# Patient Record
Sex: Male | Born: 1960 | Race: White | Hispanic: No | Marital: Married | Smoking: Never smoker
Health system: Southern US, Community
[De-identification: ages and names within clinical notes are randomized; demographics above are authoritative.]

## PROBLEM LIST (undated history)

## (undated) DIAGNOSIS — E7800 Pure hypercholesterolemia, unspecified: Secondary | ICD-10-CM

## (undated) DIAGNOSIS — N182 Chronic kidney disease, stage 2 (mild): Secondary | ICD-10-CM

## (undated) DIAGNOSIS — I2510 Atherosclerotic heart disease of native coronary artery without angina pectoris: Secondary | ICD-10-CM

## (undated) DIAGNOSIS — S83207A Unspecified tear of unspecified meniscus, current injury, left knee, initial encounter: Secondary | ICD-10-CM

## (undated) DIAGNOSIS — K921 Melena: Secondary | ICD-10-CM

## (undated) DIAGNOSIS — K219 Gastro-esophageal reflux disease without esophagitis: Secondary | ICD-10-CM

## (undated) DIAGNOSIS — Z86010 Personal history of colonic polyps: Secondary | ICD-10-CM

## (undated) DIAGNOSIS — M1712 Unilateral primary osteoarthritis, left knee: Secondary | ICD-10-CM

## (undated) DIAGNOSIS — J411 Mucopurulent chronic bronchitis: Secondary | ICD-10-CM

## (undated) DIAGNOSIS — E669 Obesity, unspecified: Secondary | ICD-10-CM

## (undated) DIAGNOSIS — I10 Essential (primary) hypertension: Secondary | ICD-10-CM

## (undated) DIAGNOSIS — J301 Allergic rhinitis due to pollen: Secondary | ICD-10-CM

## (undated) DIAGNOSIS — Z860101 Personal history of adenomatous and serrated colon polyps: Secondary | ICD-10-CM

## (undated) DIAGNOSIS — Z87448 Personal history of other diseases of urinary system: Secondary | ICD-10-CM

## (undated) DIAGNOSIS — E66812 Obesity, class 2: Secondary | ICD-10-CM

## (undated) HISTORY — DX: Obesity, unspecified: E66.9

## (undated) HISTORY — DX: Mucopurulent chronic bronchitis: J41.1

## (undated) HISTORY — DX: Unilateral primary osteoarthritis, left knee: M17.12

## (undated) HISTORY — DX: Atherosclerotic heart disease of native coronary artery without angina pectoris: I25.10

## (undated) HISTORY — DX: Personal history of other diseases of urinary system: Z87.448

## (undated) HISTORY — DX: Personal history of colonic polyps: Z86.010

## (undated) HISTORY — DX: Allergic rhinitis due to pollen: J30.1

## (undated) HISTORY — DX: Personal history of adenomatous and serrated colon polyps: Z86.0101

## (undated) HISTORY — DX: Obesity, class 2: E66.812

## (undated) HISTORY — DX: Unspecified tear of unspecified meniscus, current injury, left knee, initial encounter: S83.207A

## (undated) HISTORY — PX: COLONOSCOPY: SHX174

## (undated) HISTORY — DX: Chronic kidney disease, stage 2 (mild): N18.2

## (undated) HISTORY — DX: Pure hypercholesterolemia, unspecified: E78.00

## (undated) HISTORY — DX: Melena: K92.1

## (undated) HISTORY — DX: Gastro-esophageal reflux disease without esophagitis: K21.9

---

## 2008-06-24 HISTORY — PX: CORONARY ANGIOPLASTY WITH STENT PLACEMENT: SHX49

## 2008-09-22 HISTORY — PX: CARDIAC CATHETERIZATION: SHX172

## 2008-10-13 DIAGNOSIS — I249 Acute ischemic heart disease, unspecified: Secondary | ICD-10-CM | POA: Insufficient documentation

## 2008-10-13 DIAGNOSIS — R072 Precordial pain: Secondary | ICD-10-CM | POA: Insufficient documentation

## 2008-10-13 DIAGNOSIS — N179 Acute kidney failure, unspecified: Secondary | ICD-10-CM | POA: Insufficient documentation

## 2008-10-15 DIAGNOSIS — E782 Mixed hyperlipidemia: Secondary | ICD-10-CM | POA: Insufficient documentation

## 2013-06-24 HISTORY — PX: CARDIOVASCULAR STRESS TEST: SHX262

## 2016-03-21 DIAGNOSIS — K649 Unspecified hemorrhoids: Secondary | ICD-10-CM

## 2016-03-21 HISTORY — DX: Unspecified hemorrhoids: K64.9

## 2016-04-18 ENCOUNTER — Encounter: Payer: Self-pay | Admitting: Family Medicine

## 2019-06-25 DIAGNOSIS — M25562 Pain in left knee: Secondary | ICD-10-CM

## 2019-06-25 HISTORY — DX: Pain in left knee: M25.562

## 2019-09-16 ENCOUNTER — Other Ambulatory Visit: Payer: Self-pay

## 2019-09-16 ENCOUNTER — Emergency Department (HOSPITAL_BASED_OUTPATIENT_CLINIC_OR_DEPARTMENT_OTHER)
Admission: EM | Admit: 2019-09-16 | Discharge: 2019-09-16 | Disposition: A | Discharge status: Home / Self Care | Payer: BC Managed Care – PPO | Attending: Emergency Medicine | Admitting: Emergency Medicine

## 2019-09-16 ENCOUNTER — Encounter (HOSPITAL_BASED_OUTPATIENT_CLINIC_OR_DEPARTMENT_OTHER): Payer: Self-pay

## 2019-09-16 DIAGNOSIS — E782 Mixed hyperlipidemia: Secondary | ICD-10-CM | POA: Diagnosis not present

## 2019-09-16 DIAGNOSIS — M79604 Pain in right leg: Secondary | ICD-10-CM | POA: Diagnosis not present

## 2019-09-16 DIAGNOSIS — I10 Essential (primary) hypertension: Secondary | ICD-10-CM | POA: Diagnosis not present

## 2019-09-16 DIAGNOSIS — R2243 Localized swelling, mass and lump, lower limb, bilateral: Secondary | ICD-10-CM | POA: Diagnosis not present

## 2019-09-16 DIAGNOSIS — M79605 Pain in left leg: Secondary | ICD-10-CM | POA: Insufficient documentation

## 2019-09-16 HISTORY — DX: Essential (primary) hypertension: I10

## 2019-09-16 NOTE — ED Provider Notes (Addendum)
[facility] EMERGENCY DEPARTMENT Provider Note   CSN: [id number] Arrival date & time: 09/16/19  1735     History Chief Complaint  Patient presents with  . Leg Pain    Blake Gomez is a 59 y.o. male.  The history is provided by the patient.  Leg Pain Location:  Leg Time since incident:  2 weeks Leg location:  L leg and R leg Pain details:    Quality:  Aching   Radiates to:  Does not radiate   Severity:  Mild   Onset quality:  Gradual   Timing:  Intermittent   Progression:  Waxing and waning Prior injury to area:  No Relieved by:  Nothing Worsened by:  Activity Associated symptoms: no back pain and no fever        Past Medical History:  Diagnosis Date  . High cholesterol   . Hypertension     There are no problems to display for this patient.   Past Surgical History:  Procedure Laterality Date  . CORONARY ANGIOPLASTY WITH STENT PLACEMENT         No family history on file.  Social History   Tobacco Use  . Smoking status: Never Smoker  . Smokeless tobacco: Never Used  Substance Use Topics  . Alcohol use: Never  . Drug use: Never    Home Medications Prior to Admission medications   Not on File    Allergies    Patient has no known allergies.  Review of Systems   Review of Systems  Constitutional: Negative for chills and fever.  HENT: Negative for ear pain and sore throat.   Eyes: Negative for pain and visual disturbance.  Respiratory: Negative for cough and shortness of breath.   Cardiovascular: Positive for leg swelling. Negative for chest pain and palpitations.  Gastrointestinal: Negative for abdominal pain and vomiting.  Genitourinary: Negative for dysuria and hematuria.  Musculoskeletal: Negative for arthralgias and back pain.  Skin: Negative for color change and rash.  Neurological: Negative for seizures and syncope.  All other systems reviewed and are negative.   Physical Exam Updated Vital Signs  ED Triage Vitals    Enc Vitals Group     BP 09/16/19 1747 120/80     Pulse Rate 09/16/19 1747 66     Resp 09/16/19 1747 18     Temp 09/16/19 1747 98.2 F (36.8 C)     Temp Source 09/16/19 1747 Oral     SpO2 09/16/19 1747 97 %     Weight 09/16/19 1747 265 lb (120.2 kg)     Height 09/16/19 1747 5\' 9"  (1.753 m)     Head Circumference --      Peak Flow --      Pain Score 09/16/19 1743 9     Pain Loc --      Pain Edu? --      Excl. in GC? --     Physical Exam Vitals and nursing note reviewed.  Constitutional:      General: He is not in acute distress.    Appearance: He is well-developed. He is not ill-appearing.  HENT:     Head: Normocephalic and atraumatic.     Nose: Nose normal.     Mouth/Throat:     Mouth: Mucous membranes are moist.  Eyes:     Extraocular Movements: Extraocular movements intact.     Conjunctiva/sclera: Conjunctivae normal.     Pupils: Pupils are equal, round, and reactive to light.  Cardiovascular:  Rate and Rhythm: Normal rate and regular rhythm.     Pulses: Normal pulses.     Heart sounds: Normal heart sounds. No murmur.  Pulmonary:     Effort: Pulmonary effort is normal. No respiratory distress.     Breath sounds: Normal breath sounds.  Abdominal:     Palpations: Abdomen is soft.     Tenderness: There is no abdominal tenderness.  Musculoskeletal:        General: No tenderness. Normal range of motion.     Cervical back: Normal range of motion and neck supple.     Right lower leg: Edema (trace) present.     Left lower leg: Edema (trace) present.  Skin:    General: Skin is warm and dry.     Capillary Refill: Capillary refill takes less than 2 seconds.  Neurological:     General: No focal deficit present.     Mental Status: He is alert.     Sensory: No sensory deficit.     Motor: No weakness.  Psychiatric:        Mood and Affect: Mood normal.     ED Results / Procedures / Treatments   Labs (all labs ordered are listed, but only abnormal results are  displayed) Labs Reviewed - No data to display  EKG None  Radiology No results found.  Procedures Procedures (including critical care time)  Medications Ordered in ED Medications - No data to display  ED Course  I have reviewed the triage vital signs and the nursing notes.  Pertinent labs & imaging results that were available during my care of the patient were reviewed by me and considered in my medical decision making (see chart for details).    MDM Rules/Calculators/A&P                      Blake Gomez is a 59 year old male who presents to the ED with bilateral leg pain.  Patient works as a Development worker, community.  States that he has pain when he is working on his knees.  States that he gets cramps in his calves at times.  Wears hard sole shoes/boots.  He does not have an exam that is consistent with DVT.  He has strong pulses in his legs and doubt arterial process.  He has trace edema in his legs that I suspect may be causing some of his pain and likely from dependent edema from being on his feet and knees all day.  He has no shortness of breath.  Clear breath sounds.  No concern for heart failure.  Overall suspect muscle related pain due to work.  Recommend compression socks, more comfortable shoes.  Discharged in good condition.  Recommend follow-up with primary care doctor.  This chart was dictated using voice recognition software.  Despite best efforts to proofread,  errors can occur which can change the documentation meaning.    Final Clinical Impression(s) / ED Diagnoses Final diagnoses:  Pain in both lower extremities    Rx / DC Orders ED Discharge Orders    None       Lennice Sites, DO 09/16/19 Coleridge, [place], DO 09/16/19 1805

## 2019-09-16 NOTE — Discharge Instructions (Addendum)
Make sure you stay hydrated while working, suggest that you buy compression socks, possibly buy inserts for the soles of your shoe, try Motrin 600 mg every 8 hours as needed as well as Tylenol 1000 mg every 6 hours as needed

## 2019-09-16 NOTE — ED Notes (Signed)
Pt left before receiving d/c instructions 

## 2019-09-16 NOTE — ED Triage Notes (Signed)
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

## 2019-09-29 ENCOUNTER — Other Ambulatory Visit: Payer: Self-pay

## 2019-09-30 ENCOUNTER — Ambulatory Visit: Payer: BC Managed Care – PPO | Admitting: Family Medicine

## 2019-09-30 ENCOUNTER — Encounter: Payer: Self-pay | Admitting: Family Medicine

## 2019-09-30 ENCOUNTER — Other Ambulatory Visit: Payer: Self-pay

## 2019-09-30 ENCOUNTER — Ambulatory Visit (HOSPITAL_BASED_OUTPATIENT_CLINIC_OR_DEPARTMENT_OTHER)
Admission: RE | Admit: 2019-09-30 | Discharge: 2019-09-30 | Disposition: A | Discharge status: Home / Self Care | Payer: BC Managed Care – PPO | Source: Ambulatory Visit | Attending: Family Medicine | Admitting: Family Medicine

## 2019-09-30 VITALS — BP 113/74 | HR 84 | Temp 98.6°F | Resp 16 | Ht 70.5 in | Wt 263.4 lb

## 2019-09-30 DIAGNOSIS — I2510 Atherosclerotic heart disease of native coronary artery without angina pectoris: Secondary | ICD-10-CM | POA: Diagnosis not present

## 2019-09-30 DIAGNOSIS — M79662 Pain in left lower leg: Secondary | ICD-10-CM

## 2019-09-30 DIAGNOSIS — M19072 Primary osteoarthritis, left ankle and foot: Secondary | ICD-10-CM | POA: Diagnosis not present

## 2019-09-30 DIAGNOSIS — E7800 Pure hypercholesterolemia, unspecified: Secondary | ICD-10-CM | POA: Diagnosis not present

## 2019-09-30 DIAGNOSIS — Z86010 Personal history of colonic polyps: Secondary | ICD-10-CM | POA: Diagnosis not present

## 2019-09-30 DIAGNOSIS — M25462 Effusion, left knee: Secondary | ICD-10-CM | POA: Diagnosis not present

## 2019-09-30 MED ORDER — HYDROCODONE-ACETAMINOPHEN 5-325 MG PO TABS: 1.0000 | ORAL_TABLET | Freq: Four times a day (QID) | ORAL | 0 refills | Status: DC | PRN

## 2019-09-30 MED ORDER — DICLOFENAC SODIUM 1 % EX GEL: 4.0000 g | Freq: Four times a day (QID) | CUTANEOUS | 2 refills | Status: DC

## 2019-09-30 NOTE — Patient Instructions (Signed)
REst as much as possible for 2 wks. Stop all otc meds for pain. Apply ice to affected areas for 20 min twice per day.

## 2019-09-30 NOTE — Progress Notes (Signed)
Office Note 09/30/2019  CC:  Chief Complaint  Patient presents with  . Establish Care    Previous PCP passed away, shortly after going to [facility] for any medical needs.    HPI:  Blake Gomez is a 59 y.o. male who is here accompanied by his wife [name] to establish care and discuss leg pain problems. Patient's most recent primary MD: [name], [facility] in [place], [place]. Old records were reviewed prior to or during today's visit.  Recent ED visit for "bilat LL pain" 09/16/19. Reviewed record today. Per provider note, history and exam c/w muscular pain +/- discomfort from mild LE edema. It was felt that his work as a Development worker, community, always getting down on knees, bending a lot at knees, etc is the source of lots of his LL probs. No labs or imaging were done, no meds rx'd. Advised to f/u with primary care.  Most recent general exam and blood work were done 2019 approx.  HTN: well controlled in the past. HLD: tolerating statin. CAD: compliant with ASA/plavix/BB. No CP, SOB, palp's, arm or jaw pain, diaphoresis, nausea, or dizziness.  Onset about 1 mo ago: worsening of chronic, intermittent bilat LL pain. No preceding acute injury. Occurs mainly when he dips down working low, hurts from knee down to foot, L L much worse than R.  In fact, R leg not really bothering him right now.  No notable swelling or redness of knees.  No locking up or giving way of knees. Entire circumference of legs affected.  Some intermittent feeling of tingling and numbness in feet, occ same in L knee anterior aspect.  No hx of any imaging to eval this problem. No redness of skin noted.  No hx of DVT. He did flooring work for 20 yrs as well. Elevating legs make it worse.  Question of mild swelling on L leg.  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 Takes motrin and bayer: daily for the past week.  He recalls a bad fall approx 2012, was on ladder and it slid and he fell approx  20 ft, lots of bruising.  He did not fracture anything.  Some problems with grip weakness intermittently, related to diffuse finger joints pain.     Past Medical History:  Diagnosis Date  . Bronchitis, mucopurulent recurrent ([place])   . CAD (coronary artery disease)    stent 2014.  No MI  . GERD (gastroesophageal reflux disease)   . Hay fever   . Hematochezia 2017; 2019   Hematochezia/screening: 2017 Colonoscopy normal except adenomatous polyp.  2019 colonoscopy, +polyp, recall 2022.  [name] History of adenomatous polyp of colon 2017; 2019   Recall 2022  . Hypercholesterolemia   . Hypertension   . Obesity, Class II, BMI 35-39.9     Past Surgical History:  Procedure Laterality Date  . COLONOSCOPY  2017/2019   Hematochezia/screening: 2017 Colonoscopy normal except adenomatous polyp.  2019 colonoscopy, +polyp, recall 2022.  [name] CORONARY ANGIOPLASTY WITH STENT PLACEMENT  2014    Family History  Problem Relation Age of Onset  . Heart disease Mother   . Diverticulitis Mother   . Cancer Father   . Early death Brother        car accident  . Alzheimer's disease Maternal Grandmother     Social History   Socioeconomic History  . Marital status: Married    Spouse name: Not on file  . Number of children: Not on file  . Years of education:  Not on file  . Highest education level: Not on file  Occupational History  . Not on file  Tobacco Use  . Smoking status: Never Smoker  . Smokeless tobacco: Never Used  Substance and Sexual Activity  . Alcohol use: Never  . Drug use: Never  . Sexual activity: Not on file  Other Topics Concern  . Not on file  Social History Narrative   Married, wife [name].  No children.   Relocated from [facility] b/c lost job due to covid.   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   Lives on [address]   No T/A/Ds.   Social Determinants of Health   Financial Resource Strain:   . Difficulty of Paying Living Expenses:   Food  Insecurity:   . Worried About Programme researcher, broadcasting/film/video in the Last Year:   . Barista in the Last Year:   Transportation Needs:   . Freight forwarder (Medical):   [name] Lack of Transportation (Non-Medical):   Physical Activity:   . Days of Exercise per Week:   . Minutes of Exercise per Session:   Stress:   . Feeling of Stress :   Social Connections:   . Frequency of Communication with Friends and Family:   . Frequency of Social Gatherings with Friends and Family:   . Attends Religious Services:   . Active Member of Clubs or Organizations:   . Attends Banker Meetings:   [name] Marital Status:   Intimate Partner Violence:   . Fear of Current or Ex-Partner:   . Emotionally Abused:   [name] Physically Abused:   . Sexually Abused:     Outpatient Encounter Medications as of 09/30/2019  Medication Sig  . aspirin 81 MG EC tablet Adult Low Dose Aspirin 81 mg tablet,delayed release  Take 1 tablet every day by oral route.  [name] atorvastatin (LIPITOR) 80 MG tablet atorvastatin 80 mg tablet  . clopidogrel (PLAVIX) 75 MG tablet clopidogrel 75 mg tablet  . Fish Oil-Krill Oil (MEGARED ADVANCED 4 IN 1 PO) Take by mouth daily.  . folic acid (FOLVITE) 800 MCG tablet folic acid 800 mcg tablet  Take 1 tablet every day by oral route.  . metoprolol succinate (TOPROL-XL) 25 MG 24 hr tablet metoprolol succinate ER 25 mg tablet,extended release 24 hr  TAKE ONE TABLET BY MOUTH ONCE DAILY.  . Multiple Vitamins-Minerals (CENTRUM SILVER PO) Centrum Silver Men 300 mcg-600 mcg-300 mcg tablet  Take 1 tablet every day by oral route.  . diclofenac Sodium (VOLTAREN) 1 % GEL Apply 4 g topically 4 (four) times daily.  [name] HYDROcodone-acetaminophen (NORCO/VICODIN) 5-325 MG tablet Take 1-2 tablets by mouth every 6 (six) hours as needed for moderate pain.   No facility-administered encounter medications on file as of 09/30/2019.    No Known Allergies  ROS Review of Systems  Constitutional: Negative for appetite  change, chills, fatigue and fever.  HENT: Negative for congestion, dental problem, ear pain and sore throat.   Eyes: Negative for discharge, redness and visual disturbance.  Respiratory: Negative for cough, chest tightness, shortness of breath and wheezing.   Cardiovascular: Negative for chest pain, palpitations and leg swelling.  Gastrointestinal: Negative for abdominal pain, blood in stool, diarrhea, nausea and vomiting.  Genitourinary: Negative for difficulty urinating, dysuria, flank pain, frequency, hematuria and urgency.  Musculoskeletal: Positive for arthralgias (knees, L>>.  both hands/all fingers stiff). Negative for back pain, joint swelling, myalgias and neck stiffness.  Skin:  Negative for pallor and rash.  Neurological: Negative for dizziness, speech difficulty, weakness and headaches.  Hematological: Negative for adenopathy. Does not bruise/bleed easily.  Psychiatric/Behavioral: Negative for confusion and sleep disturbance. The patient is not nervous/anxious.     PE; Blood pressure 113/74, pulse 84, temperature 98.6 F (37 C), temperature source Temporal, resp. rate 16, height 5' 10.5" (1.791 m), weight 263 lb 6.4 oz (119.5 kg), SpO2 95 %. Body mass index is 37.26 kg/m.  Gen: Alert, well appearing.  Patient is oriented to person, place, time, and situation. AFFECT: pleasant, lucid thought and speech. GYI:RSWN: no injection, icteris, swelling, or exudate.  EOMI, PERRLA. Mouth: lips without lesion/swelling.  Oral mucosa pink and moist. Oropharynx without erythema, exudate, or swelling.  Neck - No masses or thyromegaly or limitation in range of motion CV: RRR, no m/r/g.   LUNGS: CTA bilat, nonlabored resps, good aeration in all lung fields. ABD: soft, NT/ND EXT: no clubbing or cyanosis.  No pitting edema.  DP and PT pulses 2+ bilat. Color of feet/LLs/hands is pink. R knee exam all normal. L knee exam: mild/mod pain when he gets past 90 deg flexion.  Extension ok.  Mild  crepitus. No erythema, warmth, or swelling of knees.  Minimal discomfort with patellar motion.  Very mild TTP diffusely over entire knee but most significantly over patellar tendon and peripatellar soft tissues.  Joint line tenderness minimal. Mild TTP of entire LL w/out any distinct focal area of marked severity.  Pertinent labs:  none  ASSESSMENT AND PLAN:   New pt; reviewed available records.  1) Bilat, L>>>R leg pains from knees down. I suspect he does have some knee osteoarthritis but I don't think that explains all his sx's by any means. I think most of this is overuse syndrome.  No frank signs of any overuse "compartment syndrome". Biggest recommendation at this time is REST-->I recommended 2 wks no work.  He said he'll try. Check L knee and L tib/fib x-rays for signs of signif osteoarthritis and/or stress fracture. Low suspicion of acute DVT, but will check D dimer and if positive I'll get LE venous duplex doppler. Stop all NSAIDs. Start voltaren gel 4 g to affected area qid prn. Vicodin 5/325, 1-2 q6h prn.  Therapeutic expectations and side effect profile of medication discussed today.  Patient's questions answered.  Short term use of this med is the plan and I made this clear today. If x-rays show significant component of knee arthritis (particularly if any effusion is noted), I'll have him return for knee joint steroid injection.  2) CAD: he is asymptomatic and is on ASA, plavix, and metoprolol long term. Will refer him to local cardiologist to resume chronic CAD f/u. Will try to get prior cardiologist's records. Check CMET and CBC today. Lipid panel when we get an opportunity when he is fasting.  3) Hx of adenomatous colon polyps: due for repeat colonoscopy in late 2022.  4) Bilat hands/finger osteoarthritis. No tx for now, but if voltaren helpful for his knee then we may try this for hands.  An After Visit Summary was printed and given to the patient.  Spent 50 min  with pt today gathering hx, reviewing old records, discussing current prob and chronic dz's, and formulating/discussing diagnoses and plans.  Return for f/u to be determined based on results of work up and response to tx.  Signed:  Santiago Bumpers, MD           09/30/2019

## 2019-10-01 LAB — COMPREHENSIVE METABOLIC PANEL
ALT: 19 U/L (ref 0–53)
AST: 23 U/L (ref 0–37)
Albumin: 4.3 g/dL (ref 3.5–5.2)
Alkaline Phosphatase: 110 U/L (ref 39–117)
BUN: 26 mg/dL — ABNORMAL HIGH (ref 6–23)
CO2: 25 mEq/L (ref 19–32)
Calcium: 9 mg/dL (ref 8.4–10.5)
Chloride: 105 mEq/L (ref 96–112)
Creatinine, Ser: 1.1 mg/dL (ref 0.40–1.50)
GFR: 69.02 mL/min (ref >60.00)
Glucose, Bld: 102 mg/dL — ABNORMAL HIGH (ref 70–99)
Potassium: 4.4 mEq/L (ref 3.5–5.1)
Sodium: 140 mEq/L (ref 135–145)
Total Bilirubin: 0.5 mg/dL (ref 0.2–1.2)
Total Protein: 6.9 g/dL (ref 6.0–8.3)

## 2019-10-01 LAB — CBC WITH DIFFERENTIAL/PLATELET
Basophils Absolute: 0.1 10*3/uL (ref 0.0–0.1)
Basophils Relative: 1 % (ref 0.0–3.0)
Eosinophils Absolute: 0.3 10*3/uL (ref 0.0–0.7)
Eosinophils Relative: 3.4 % (ref 0.0–5.0)
HCT: 43.4 % (ref 39.0–52.0)
Hemoglobin: 15.3 g/dL (ref 13.0–17.0)
Lymphocytes Relative: 16.8 % (ref 12.0–46.0)
Lymphs Abs: 1.4 10*3/uL (ref 0.7–4.0)
MCHC: 35.3 g/dL (ref 30.0–36.0)
MCV: 94.7 fl (ref 78.0–100.0)
Monocytes Absolute: 0.5 10*3/uL (ref 0.1–1.0)
Monocytes Relative: 6.2 % (ref 3.0–12.0)
Neutro Abs: 6.2 10*3/uL (ref 1.4–7.7)
Neutrophils Relative %: 72.6 % (ref 43.0–77.0)
Platelets: 260 10*3/uL (ref 150.0–400.0)
RBC: 4.58 Mil/uL (ref 4.22–5.81)
RDW: 13.3 % (ref 11.5–15.5)
WBC: 8.5 10*3/uL (ref 4.0–10.5)

## 2019-10-01 LAB — D-DIMER, QUANTITATIVE: D-Dimer, Quant: 0.45 mcg/mL FEU (ref <0.50)

## 2019-10-05 ENCOUNTER — Encounter: Payer: Self-pay | Admitting: Family Medicine

## 2019-10-06 ENCOUNTER — Telehealth: Payer: Self-pay | Admitting: Family Medicine

## 2019-10-06 DIAGNOSIS — M958 Other specified acquired deformities of musculoskeletal system: Secondary | ICD-10-CM

## 2019-10-06 DIAGNOSIS — M25562 Pain in left knee: Secondary | ICD-10-CM

## 2019-10-06 NOTE — Telephone Encounter (Signed)
MRI knee ordered.

## 2019-10-06 NOTE — Telephone Encounter (Signed)
LM for pt to return call to notify imaging ordered.

## 2019-10-06 NOTE — Telephone Encounter (Signed)
Patient called back today and was given lab results and x-ray results.  Patient is agreeable to MRI.  Please place order.

## 2019-10-06 NOTE — Progress Notes (Signed)
Cardiology Office Note:    Date:  10/07/2019   ID:  Blake Gomez, DOB [birth year], MRN [id number]  PCP:  Jeoffrey Massed, MD  Cardiologist:  Norman Herrlich, MD   Referring MD: Jeoffrey Massed, MD  ASSESSMENT:    1. Coronary artery disease of native artery of native heart with stable angina pectoris (HCC)   2. Dyslipidemia   3. Mixed hyperlipidemia    PLAN:    In order of problems listed above:  1. Stable CAD remote PCI 10 years ago certainly would be at risk with the age of onset and family history and should have an LP(a) level done I cannot reconcile the patient and wife telling me that he had lipids checked with the chart I will send a note to his PCP and they were not done he should come back and have them performed and check an LP(a) level.  If elevated there is a specific antisense oligonucleotide that should be on the market soon to treat this disorder and if he has a residual LDL greater than 70 on a statin I will place him on a PCSK9 inhibitor in the interim.  Continue his current medical treatment and was given a prescription for nitroglycerin.  Gust an ischemia evaluation but the patient I feel he does not need it at this time he is awaiting his records which were requested from [facility] 2. New statin if he did not have a lipid profile should be performed I would do a LP(a) level and if greater than 50 on a statin and a residual LDL greater than 70 in the interim would benefit from combined PCSK9 therapy along with a statin pending approval of the antisense oligonucleotide specifically for LP(a) access.  Next appointment 1 year  Medication Adjustments/Labs and Tests Ordered: Current medicines are reviewed at length with the patient today.  Concerns regarding medicines are outlined above.  Orders Placed This Encounter  Procedures  . EKG 12-Lead   Meds ordered this encounter  Medications  . nitroGLYCERIN (NITROSTAT) 0.4 MG SL tablet    Sig: Place  1 tablet (0.4 mg total) under the tongue every 5 (five) minutes as needed for chest pain.    Dispense:  25 tablet    Refill:  3     No chief complaint on file.   History of Present Illness:    Blake Gomez is a 59 y.o. male who is being seen today for the evaluation of coronary artery disease with a history of myocardial infarction PCI and stent 2011 at the request of McGowen, Maryjean Morn, MD. Unfortunately no cardiology records are available in care everywhere. 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  He has had no chest pain shortness of breath palpitation or syncope compliant with his dual antiplatelet therapy without GI upset or bleeding in his statin without muscle pain or weakness.  He was 59 years old at the time of intervention has a family history of premature CAD was a non-smoker and certainly is at risk for LP(a) abnormality.  He has no history of congenital rheumatic heart disease Past Medical History:  Diagnosis Date  . Bronchitis, mucopurulent recurrent (HCC)   . CAD (coronary artery disease)    stent 2014.  No MI  . GERD (gastroesophageal reflux disease)   .  Hay fever   . Hematochezia 2017; 2019   Hematochezia/screening: 2017 Colonoscopy normal except adenomatous polyp.  2019 colonoscopy, +polyp, recall 2022.  [name] History of adenomatous polyp of colon 2017; 2019   Recall 2022  . Hypercholesterolemia   . Hypertension   . Obesity, Class II, BMI 35-39.9     Past Surgical History:  Procedure Laterality Date  . COLONOSCOPY  2017/2019   Hematochezia/screening: 2017 Colonoscopy normal except adenomatous polyp.  2019 colonoscopy, +polyp, recall 2022.  [name] CORONARY ANGIOPLASTY WITH STENT PLACEMENT  2014    Current Medications: Current Meds  Medication Sig  . aspirin 81 MG EC tablet Adult Low Dose Aspirin 81 mg  tablet,delayed release  Take 1 tablet every day by oral route.  [name] atorvastatin (LIPITOR) 80 MG tablet atorvastatin 80 mg tablet  . clopidogrel (PLAVIX) 75 MG tablet clopidogrel 75 mg tablet  . diclofenac Sodium (VOLTAREN) 1 % GEL Apply 4 g topically 4 (four) times daily.  . Fish Oil-Krill Oil (MEGARED ADVANCED 4 IN 1 PO) Take by mouth daily.  . folic acid (FOLVITE) 800 MCG tablet folic acid 800 mcg tablet  Take 1 tablet every day by oral route.  [name] HYDROcodone-acetaminophen (NORCO/VICODIN) 5-325 MG tablet Take 1-2 tablets by mouth every 6 (six) hours as needed for moderate pain.  . metoprolol succinate (TOPROL-XL) 25 MG 24 hr tablet metoprolol succinate ER 25 mg tablet,extended release 24 hr  TAKE ONE TABLET BY MOUTH ONCE DAILY.  . Multiple Vitamins-Minerals (CENTRUM SILVER PO) Centrum Silver Men 300 mcg-600 mcg-300 mcg tablet  Take 1 tablet every day by oral route.     Allergies:   Patient has no known allergies.   Social History   Socioeconomic History  . Marital status: Married    Spouse name: Not on file  . Number of children: Not on file  . Years of education: Not on file  . Highest education level: Not on file  Occupational History  . Not on file  Tobacco Use  . Smoking status: Never Smoker  . Smokeless tobacco: Never Used  Substance and Sexual Activity  . Alcohol use: Never  . Drug use: Never  . Sexual activity: Not on file  Other Topics Concern  . Not on file  Social History Narrative   Married, wife [name].  No children.   Relocated from [facility] b/c lost job due to covid.   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   Lives on [address]   No T/A/Ds.   Social Determinants of Health   Financial Resource Strain:   . Difficulty of Paying Living Expenses:   Food Insecurity:   . Worried About Programme researcher, broadcasting/film/video in the Last Year:   . Barista in the Last Year:   Transportation Needs:   . Freight forwarder (Medical):   [name] Lack of  Transportation (Non-Medical):   Physical Activity:   . Days of Exercise per Week:   . Minutes of Exercise per Session:   Stress:   . Feeling of Stress :   Social Connections:   . Frequency of Communication with Friends and Family:   . Frequency of Social Gatherings with Friends and Family:   . Attends Religious Services:   . Active Member of Clubs or Organizations:   . Attends Banker Meetings:   [name] Marital Status:      Family History: The patient's family history includes Alzheimer's disease in his maternal grandmother; Cancer in  his father; Diverticulitis in his mother; Early death in his brother; Heart disease in his mother.  ROS:   Review of Systems  Constitution: Negative.  HENT: Negative.   Eyes: Negative.   Cardiovascular: Negative.   Respiratory: Negative.   Endocrine: Negative.   Hematologic/Lymphatic: Negative.   Skin: Negative.   Musculoskeletal: Positive for joint pain.  Gastrointestinal: Negative.   Genitourinary: Negative.   Neurological: Negative.   Psychiatric/Behavioral: Negative.   Allergic/Immunologic: Negative.    Please see the history of present illness.     All other systems reviewed and are negative.  EKGs/Labs/Other Studies Reviewed:    The following studies were reviewed today:   EKG:  EKG is  ordered today.  The ekg ordered today is personally reviewed and demonstrates rhythm and is normal  Recent Labs: 09/30/2019: ALT 19; BUN 26; Creatinine, Ser 1.10; Hemoglobin 15.3; Platelets 260.0; Potassium 4.4; Sodium 140  Recent Lipid Panel Patient and his wife told me he had a lipid profile drawn at his PCP visit  Physical Exam:    VS:  BP 124/72   Pulse 82   Ht 5' 10.5" (1.791 m)   Wt 265 lb 6.4 oz (120.4 kg)   SpO2 98%   BMI 37.54 kg/m     Wt Readings from Last 3 Encounters:  10/07/19 265 lb 6.4 oz (120.4 kg)  09/30/19 263 lb 6.4 oz (119.5 kg)  09/16/19 265 lb (120.2 kg)     GEN:  Well nourished, well developed in no acute  distress HEENT: Normal NECK: No JVD; No carotid bruits LYMPHATICS: No lymphadenopathy CARDIAC: RRR, no murmurs, rubs, gallops RESPIRATORY:  Clear to auscultation without rales, wheezing or rhonchi  ABDOMEN: Soft, non-tender, non-distended MUSCULOSKELETAL:  No edema; No deformity  SKIN: Warm and dry NEUROLOGIC:  Alert and oriented x 3 PSYCHIATRIC:  Normal affect     Signed, Shirlee More, MD  10/07/2019 9:21 AM    [place]

## 2019-10-07 ENCOUNTER — Ambulatory Visit: Payer: BC Managed Care – PPO | Admitting: Cardiology

## 2019-10-07 ENCOUNTER — Other Ambulatory Visit: Payer: Self-pay

## 2019-10-07 ENCOUNTER — Encounter: Payer: Self-pay | Admitting: Cardiology

## 2019-10-07 VITALS — BP 124/72 | HR 82 | Ht 70.5 in | Wt 265.4 lb

## 2019-10-07 DIAGNOSIS — E785 Hyperlipidemia, unspecified: Secondary | ICD-10-CM | POA: Diagnosis not present

## 2019-10-07 DIAGNOSIS — E782 Mixed hyperlipidemia: Secondary | ICD-10-CM

## 2019-10-07 DIAGNOSIS — I25118 Atherosclerotic heart disease of native coronary artery with other forms of angina pectoris: Secondary | ICD-10-CM

## 2019-10-07 MED ORDER — NITROGLYCERIN 0.4 MG SL SUBL: 0.4000 mg | SUBLINGUAL_TABLET | SUBLINGUAL | 3 refills | Status: DC | PRN

## 2019-10-07 NOTE — Patient Instructions (Signed)
Medication Instructions:  Your physician has recommended you make the following change in your medication:  START: Nitroglycerin 0.4 mg take one tablet by mouth every 5 minutes as needed for chest pain.  *If you need a refill on your cardiac medications before your next appointment, please call your pharmacy*   Lab Work: None If you have labs (blood work) drawn today and your tests are completely normal, you will receive your results only by: [name] MyChart Message (if you have MyChart) OR . A paper copy in the mail If you have any lab test that is abnormal or we need to change your treatment, we will call you to review the results.   Testing/Procedures: None   Follow-Up: At [facility], you and your health needs are our priority.  As part of our continuing mission to provide you with exceptional heart care, we have created designated Provider Care Teams.  These Care Teams include your primary Cardiologist (physician) and Advanced Practice Providers (APPs -  Physician Assistants and Nurse Practitioners) who all work together to provide you with the care you need, when you need it.  We recommend signing up for the patient portal called "MyChart".  Sign up information is provided on this After Visit Summary.  MyChart is used to connect with patients for Virtual Visits (Telemedicine).  Patients are able to view lab/test results, encounter notes, upcoming appointments, etc.  Non-urgent messages can be sent to your provider as well.   To learn more about what you can do with MyChart, go to [email or web address].    Your next appointment:   1 year(s)  The format for your next appointment:   In Person  Provider:   Norman Herrlich, MD   Other Instructions

## 2019-10-07 NOTE — Telephone Encounter (Signed)
Patient contacted regarding MRI order.

## 2019-10-08 ENCOUNTER — Encounter: Payer: Self-pay | Admitting: Family Medicine

## 2019-10-08 NOTE — Progress Notes (Signed)
Per PCP's office visit, patient to be out of work for 2 weeks. Patient requested letter for today only.

## 2019-10-12 ENCOUNTER — Telehealth: Payer: Self-pay | Admitting: Family Medicine

## 2019-10-12 DIAGNOSIS — E7800 Pure hypercholesterolemia, unspecified: Secondary | ICD-10-CM

## 2019-10-12 NOTE — Telephone Encounter (Signed)
Pls call and ask pt to schedule fasting lab appt at his earliest convenience. His cardiologist contacted me and has recommended a couple of labs.  I'll place orders and we'll need to forward results to Dr. Steward Drone

## 2019-10-12 NOTE — Telephone Encounter (Signed)
Patient contacted for lab visit, scheduled for 4/23

## 2019-10-14 ENCOUNTER — Encounter: Payer: Self-pay | Admitting: Family Medicine

## 2019-10-15 ENCOUNTER — Ambulatory Visit: Payer: BC Managed Care – PPO

## 2019-10-18 ENCOUNTER — Ambulatory Visit: Payer: BC Managed Care – PPO | Admitting: Medical

## 2019-11-05 ENCOUNTER — Telehealth: Payer: Self-pay

## 2019-11-05 NOTE — Telephone Encounter (Signed)
Insurance forms we received regarding MRI faxed to office. Forms placed back on PCP desk and sending to Dr.McGowen as FYI  Client [facility] Day - Client Client Site [facility] - Day Physician Santiago Bumpers - MD Contact Type Call Who Is Calling Physician / Provider / Hospital Call Type Provider Call Message Only Reason for Call Request to send message to Office Initial Comment Carollee Herter at [address] [facility] in [facility] has a pt. who is asking if their MRI has been authorized by the insurance so it can be scheduled. Pt. Yisroel Ramming (02/01/1961). Please call (whoever answers at ) [phone] or fax to [phone] if have the authorized forms. Office hours provided. Additional Comment Disp. Time Disposition Final User 11/05/2019 7:53:33 AM General Information Provided Yes Gokounous, Erin Call Closed By: Tomie China Transaction Date/Time: 11/05/2019 7:48:51 AM (ET)

## 2019-11-07 NOTE — Telephone Encounter (Signed)
Great - thanks

## 2019-11-13 ENCOUNTER — Ambulatory Visit (HOSPITAL_BASED_OUTPATIENT_CLINIC_OR_DEPARTMENT_OTHER)
Admission: RE | Admit: 2019-11-13 | Discharge: 2019-11-13 | Disposition: A | Discharge status: Home / Self Care | Payer: BC Managed Care – PPO | Source: Ambulatory Visit | Attending: Family Medicine | Admitting: Family Medicine

## 2019-11-13 ENCOUNTER — Other Ambulatory Visit: Payer: Self-pay

## 2019-11-13 DIAGNOSIS — M958 Other specified acquired deformities of musculoskeletal system: Secondary | ICD-10-CM | POA: Insufficient documentation

## 2019-11-13 DIAGNOSIS — M25562 Pain in left knee: Secondary | ICD-10-CM

## 2019-11-13 DIAGNOSIS — M23222 Derangement of posterior horn of medial meniscus due to old tear or injury, left knee: Secondary | ICD-10-CM | POA: Diagnosis not present

## 2019-11-15 ENCOUNTER — Encounter: Payer: Self-pay | Admitting: Family Medicine

## 2019-11-17 ENCOUNTER — Other Ambulatory Visit: Payer: Self-pay

## 2019-11-17 DIAGNOSIS — S83242A Other tear of medial meniscus, current injury, left knee, initial encounter: Secondary | ICD-10-CM

## 2019-11-17 DIAGNOSIS — S83282A Other tear of lateral meniscus, current injury, left knee, initial encounter: Secondary | ICD-10-CM

## 2019-11-17 DIAGNOSIS — M1712 Unilateral primary osteoarthritis, left knee: Secondary | ICD-10-CM

## 2019-11-17 DIAGNOSIS — M25562 Pain in left knee: Secondary | ICD-10-CM

## 2019-12-08 ENCOUNTER — Ambulatory Visit: Payer: BC Managed Care – PPO | Admitting: Orthopedic Surgery

## 2019-12-08 VITALS — Ht 70.0 in | Wt 260.0 lb

## 2019-12-08 DIAGNOSIS — S83207A Unspecified tear of unspecified meniscus, current injury, left knee, initial encounter: Secondary | ICD-10-CM

## 2019-12-08 DIAGNOSIS — M1712 Unilateral primary osteoarthritis, left knee: Secondary | ICD-10-CM | POA: Diagnosis not present

## 2019-12-10 ENCOUNTER — Encounter: Payer: Self-pay | Admitting: Orthopedic Surgery

## 2019-12-10 NOTE — Progress Notes (Signed)
Office Visit Note   Patient: Blake Gomez           Date of Birth: [birth year]           MRN: [id number] Visit Date: 12/08/2019 Requested by: Tammi Sou, MD [address] [place],  [place] [address] PCP: Tammi Sou, MD  Subjective: Chief Complaint  Patient presents with  . Left Knee - Pain    HPI: Blake Gomez is a 59 y.o. male who presents to the office complaining of left knee pain.  Patient notes pain for 4 months.  He denies any injury.  He has had no previous injury to his left knee or previous issue with his left knee.  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  He localizes pain in the medial aspect and states that is difficult to get up when he is working on his knees.  He denies any groin pain, low back pain, radicular pain, significant numbness/tingling.  He denies any instability or mechanical symptoms.  He takes occasional Tylenol.  Pain does wake him up at night on occasion.  He has been seen by his primary care physician Dr Anitra Lauth who ordered an MRI scan of the left knee that revealed extensive degenerative tearing of the posterior horn and body of the medial meniscus with a small radial tear the posterior horn the lateral meniscus as well as tricompartmental degenerative changes, worst in the medial compartment..                ROS:  All systems reviewed are negative as they relate to the chief complaint within the history of present illness.  Patient denies fevers or chills.  Assessment & Plan: Visit Diagnoses:  1. Unilateral primary osteoarthritis, left knee   2. Acute meniscal tear of left knee, initial encounter     Plan: Patient is a 59 year old male presents complaining of left knee pain.  Left knee pain is been present for 4 months without injury.  He denies any radicular pain, groin pain, low back pain, mechanical or instability symptoms.  His main concern is just pain.  MRI scan reviewed which revealed multiple meniscal tears  as well as degenerative changes which are worst in the medial compartment.  Discussed options available to patient.  He wants to avoid any surgical intervention if possible.  Plan to proceed with cortisone injection.  Patient tolerated the left knee cortisone injection well.  Follow-up as needed with specific attention to mechanical symptoms and recurrent effusion as a Bell whether for the need for either another injection or surgical debridement..  Follow-Up Instructions: No follow-ups on file.   Orders:  No orders of the defined types were placed in this encounter.  No orders of the defined types were placed in this encounter.     Procedures: Large Joint Inj: L knee on 12/11/2019 1:34 PM Indications: diagnostic evaluation, joint swelling and pain Details: 18 G 1.5 in needle, superolateral approach  Arthrogram: No  Medications: 5 mL lidocaine 1 %; 40 mg methylPREDNISolone acetate 40 MG/ML; 4 mL bupivacaine 0.25 % Outcome: tolerated well, no immediate complications Procedure, treatment alternatives, risks and benefits explained, specific risks discussed. Consent was given by the patient. Immediately prior to procedure a time out was called to verify the correct patient, procedure, equipment, support staff and site/side marked as required. Patient was prepped and draped in the usual sterile fashion.       Clinical Data:  No additional findings.  Objective: Vital Signs: Ht 5\' 10"  (1.778 m)   Wt 260 lb (117.9 kg)   BMI 37.31 kg/m   Physical Exam:  Constitutional: Patient appears well-developed HEENT:  Head: Normocephalic Eyes:EOM are normal Neck: Normal range of motion Cardiovascular: Normal rate Pulmonary/chest: Effort normal Neurologic: Patient is alert Skin: Skin is warm Psychiatric: Patient has normal mood and affect  Ortho Exam:  Left knee Exam No effusion Tender to palpation of the medial joint line moderately.  Mild tenderness to palpation over the lateral joint  line Extensor mechanism intact No TTP over the quad tendon, patellar tendon, pes anserinus, patella, tibial tubercle, LCL/MCL insertions Stable to varus/valgus stresses.  Stable to anterior/posterior drawer Extension to 0 degrees Flexion > 90 degrees  Specialty Comments:  No specialty comments available.  Imaging: No results found.   PMFS History: Patient Active Problem List   Diagnosis Date Noted  . Hemorrhoids 03/21/2016  . Hyperlipidemia 10/15/2008  . Acute renal failure syndrome (HCC) 10/13/2008  . Acute coronary syndrome (HCC) 10/13/2008  . Precordial pain 10/13/2008   Past Medical History:  Diagnosis Date  . Bronchitis, mucopurulent recurrent (HCC)   . CAD (coronary artery disease)    stent 2014.  No MI.  Echo normal.  . Chronic renal insufficiency, stage 2 (mild)   . GERD (gastroesophageal reflux disease)   . Hay fever   . Hematochezia 2017; 2019   Hematochezia/screening: 2017 Colonoscopy normal except adenomatous polyp.  2019 colonoscopy, +polyp, recall 2022.  2023 History of adenomatous polyp of colon 2017; 2019   Recall 2022  . History of hematuria    per old records  . Hypercholesterolemia   . Hypertension   . Left knee pain 2021   MRI 10/2019-> moderate DJD medial mostly, +deg tear of medial and lateral menisci, +effusion, +popliteal cyst->recommended ortho  . Obesity, Class II, BMI 35-39.9     Family History  Problem Relation Age of Onset  . Heart disease Mother   . Diverticulitis Mother   . Cancer Father   . Early death Brother        car accident  . Alzheimer's disease Maternal Grandmother     Past Surgical History:  Procedure Laterality Date  . CARDIAC CATHETERIZATION  09/2008   95% LAD lesion-->DES  . CARDIOVASCULAR STRESS TEST  2015   cardiolyte->no ischemia.  EF 60%.  . COLONOSCOPY  2017/2019   Hematochezia/screening: 2017 Colonoscopy normal except adenomatous polyp.  2019 colonoscopy, +polyp, recall 2022.  2023 CORONARY ANGIOPLASTY WITH STENT  PLACEMENT  2010   DES to LAD   Social History   Occupational History  . Not on file  Tobacco Use  . Smoking status: Never Smoker  . Smokeless tobacco: Never Used  Vaping Use  . Vaping Use: Never used  Substance and Sexual Activity  . Alcohol use: Never  . Drug use: Never  . Sexual activity: Not on file

## 2019-12-11 ENCOUNTER — Encounter: Payer: Self-pay | Admitting: Orthopedic Surgery

## 2019-12-11 MED ORDER — BUPIVACAINE HCL 0.25 % IJ SOLN
4.0000 mL | INTRAMUSCULAR | Status: AC | PRN
  Administered 2019-12-11: 4 mL via INTRA_ARTICULAR

## 2019-12-11 MED ORDER — METHYLPREDNISOLONE ACETATE 40 MG/ML IJ SUSP
40.0000 mg | INTRAMUSCULAR | Status: AC | PRN
  Administered 2019-12-11: 40 mg via INTRA_ARTICULAR

## 2019-12-11 MED ORDER — LIDOCAINE HCL 1 % IJ SOLN
5.0000 mL | INTRAMUSCULAR | Status: AC | PRN
  Administered 2019-12-11: 5 mL

## 2020-02-04 ENCOUNTER — Ambulatory Visit: Payer: BC Managed Care – PPO | Admitting: Family Medicine

## 2020-02-04 ENCOUNTER — Other Ambulatory Visit: Payer: Self-pay

## 2020-02-04 ENCOUNTER — Encounter: Payer: Self-pay | Admitting: Family Medicine

## 2020-02-04 VITALS — BP 125/74 | HR 60 | Temp 97.8°F | Resp 16 | Wt 273.6 lb

## 2020-02-04 DIAGNOSIS — R252 Cramp and spasm: Secondary | ICD-10-CM | POA: Diagnosis not present

## 2020-02-04 DIAGNOSIS — R5383 Other fatigue: Secondary | ICD-10-CM | POA: Diagnosis not present

## 2020-02-04 DIAGNOSIS — E860 Dehydration: Secondary | ICD-10-CM | POA: Diagnosis not present

## 2020-02-04 DIAGNOSIS — R61 Generalized hyperhidrosis: Secondary | ICD-10-CM | POA: Diagnosis not present

## 2020-02-04 LAB — COMPREHENSIVE METABOLIC PANEL
ALT: 24 U/L (ref 0–53)
AST: 28 U/L (ref 0–37)
Albumin: 4.2 g/dL (ref 3.5–5.2)
Alkaline Phosphatase: 117 U/L (ref 39–117)
BUN: 22 mg/dL (ref 6–23)
CO2: 29 mEq/L (ref 19–32)
Calcium: 8.9 mg/dL (ref 8.4–10.5)
Chloride: 103 mEq/L (ref 96–112)
Creatinine, Ser: 1.16 mg/dL (ref 0.40–1.50)
GFR: 64.83 mL/min (ref >60.00)
Glucose, Bld: 118 mg/dL — ABNORMAL HIGH (ref 70–99)
Potassium: 4.2 mEq/L (ref 3.5–5.1)
Sodium: 140 mEq/L (ref 135–145)
Total Bilirubin: 0.7 mg/dL (ref 0.2–1.2)
Total Protein: 6.8 g/dL (ref 6.0–8.3)

## 2020-02-04 LAB — CBC WITH DIFFERENTIAL/PLATELET
Basophils Absolute: 0 10*3/uL (ref 0.0–0.1)
Basophils Relative: 0.6 % (ref 0.0–3.0)
Eosinophils Absolute: 0.2 10*3/uL (ref 0.0–0.7)
Eosinophils Relative: 3.2 % (ref 0.0–5.0)
HCT: 44 % (ref 39.0–52.0)
Hemoglobin: 15.4 g/dL (ref 13.0–17.0)
Lymphocytes Relative: 29.8 % (ref 12.0–46.0)
Lymphs Abs: 1.9 10*3/uL (ref 0.7–4.0)
MCHC: 34.9 g/dL (ref 30.0–36.0)
MCV: 94.5 fl (ref 78.0–100.0)
Monocytes Absolute: 0.4 10*3/uL (ref 0.1–1.0)
Monocytes Relative: 6.2 % (ref 3.0–12.0)
Neutro Abs: 3.9 10*3/uL (ref 1.4–7.7)
Neutrophils Relative %: 60.2 % (ref 43.0–77.0)
Platelets: 247 10*3/uL (ref 150.0–400.0)
RBC: 4.66 Mil/uL (ref 4.22–5.81)
RDW: 13.8 % (ref 11.5–15.5)
WBC: 6.4 10*3/uL (ref 4.0–10.5)

## 2020-02-04 LAB — TSH: TSH: 1.68 u[IU]/mL (ref 0.35–4.50)

## 2020-02-04 LAB — T4, FREE: Free T4: 0.82 ng/dL (ref 0.60–1.60)

## 2020-02-04 NOTE — Progress Notes (Signed)
OFFICE VISIT  02/04/2020   CC:  Chief Complaint  Patient presents with  . Follow-up    excessive sweating, feeling off, cramps   HPI:    Patient is a 59 y.o. male who presents for "excessive sweating, feeling off, cramps".  Sweating a lot, particularly at work, shirt wet, some aching/cramping intermittently last couple days.  Some nausea intermittently.  General heat intol. No CP, no SOB, no abd pain, eating well.  NO HAs.  +Mild dizzy feeling when very hot. Daily fluid intake: 66-75 oz, also some gatorade.  NO signif sodas, tea, or coffee. Has not taken any pain meds in several weeks. Pain in his knee is doing fine lately. No signif stress/anxiety/dep lately.  No diarrhea.  Occ tremulousness.  No melena or hematochezia. No urinary sx's.  ROS: see above, plus->no fevers,SOB, no wheezing, no cough, no rashesNo polyuria or polydipsia.  No focal weakness or paresthesias.  No acute vision or hearing abnormalities. No abd pain. No palpitations.    Past Medical History:  Diagnosis Date  . Bronchitis, mucopurulent recurrent (HCC)   . CAD (coronary artery disease)    stent 2014.  No MI.  Echo normal.  . Chronic renal insufficiency, stage 2 (mild)   . GERD (gastroesophageal reflux disease)   . Hay fever   . Hematochezia 2017; 2019   Hematochezia/screening: 2017 Colonoscopy normal except adenomatous polyp.  2019 colonoscopy, +polyp, recall 2022.  [name] History of adenomatous polyp of colon 2017; 2019   Recall 2022  . History of hematuria    per old records  . Hypercholesterolemia   . Hypertension   . Left knee pain 2021   MRI 10/2019-> moderate DJD medial mostly, +deg tear of medial and lateral menisci, +effusion, +popliteal cyst->recommended ortho  . Obesity, Class II, BMI 35-39.9   . Unilateral primary osteoarthritis, left knee    + deg menisc tears: Dr. August Saucer, steroid inj 11/2019, pt wants to avoid surgery    Past Surgical History:  Procedure Laterality Date  . CARDIAC  CATHETERIZATION  09/2008   95% LAD lesion-->DES  . CARDIOVASCULAR STRESS TEST  2015   cardiolyte->no ischemia.  EF 60%.  . COLONOSCOPY  2017/2019   Hematochezia/screening: 2017 Colonoscopy normal except adenomatous polyp.  2019 colonoscopy, +polyp, recall 2022.  [name] CORONARY ANGIOPLASTY WITH STENT PLACEMENT  2010   DES to LAD    Outpatient Medications Prior to Visit  Medication Sig Dispense Refill  . aspirin 81 MG EC tablet Adult Low Dose Aspirin 81 mg tablet,delayed release  Take 1 tablet every day by oral route.    [name] atorvastatin (LIPITOR) 80 MG tablet atorvastatin 80 mg tablet    . clopidogrel (PLAVIX) 75 MG tablet clopidogrel 75 mg tablet    . diclofenac Sodium (VOLTAREN) 1 % GEL Apply 4 g topically 4 (four) times daily. 100 g 2  . Fish Oil-Krill Oil (MEGARED ADVANCED 4 IN 1 PO) Take by mouth daily.    . folic acid (FOLVITE) 800 MCG tablet folic acid 800 mcg tablet  Take 1 tablet every day by oral route.    . metoprolol succinate (TOPROL-XL) 25 MG 24 hr tablet metoprolol succinate ER 25 mg tablet,extended release 24 hr  TAKE ONE TABLET BY MOUTH ONCE DAILY.    . Multiple Vitamins-Minerals (CENTRUM SILVER PO) Centrum Silver Men 300 mcg-600 mcg-300 mcg tablet  Take 1 tablet every day by oral route.    . nitroGLYCERIN (NITROSTAT) 0.4 MG SL tablet Place 1 tablet (0.4 mg total) under the  tongue every 5 (five) minutes as needed for chest pain. 25 tablet 3  . HYDROcodone-acetaminophen (NORCO/VICODIN) 5-325 MG tablet Take 1-2 tablets by mouth every 6 (six) hours as needed for moderate pain. (Patient not taking: Reported on 02/04/2020) 42 tablet 0   No facility-administered medications prior to visit.    No Known Allergies  ROS As per HPI  PE: Vitals with BMI 02/04/2020 12/08/2019 10/07/2019  Height - 5\' 10"  5' 10.5"  Weight 273 lbs 10 oz 260 lbs 265 lbs 6 oz  BMI - 37.31 37.53  Systolic 125 - 124  Diastolic 74 - 72  Pulse 60 - 82  O2 sat on RA today is 97%  Gen: Alert, well  appearing.  Patient is oriented to person, place, time, and situation. AFFECT: pleasant, lucid thought and speech. CV: RRR, no m/r/g.   LUNGS: CTA bilat, nonlabored resps, good aeration in all lung fields. ABD: soft, NT/ND EXT: no clubbing or cyanosis.  no edema.  SKIN: no jaundice, pallor, or rash. No clamminess, no visible sweating.  LABS:    Chemistry      Component Value Date/Time   NA 140 09/30/2019 1601   K 4.4 09/30/2019 1601   CL 105 09/30/2019 1601   CO2 25 09/30/2019 1601   BUN 26 (H) 09/30/2019 1601   CREATININE 1.10 09/30/2019 1601      Component Value Date/Time   CALCIUM 9.0 09/30/2019 1601   ALKPHOS 110 09/30/2019 1601   AST 23 09/30/2019 1601   ALT 19 09/30/2019 1601   BILITOT 0.5 09/30/2019 1601     Lab Results  Component Value Date   WBC 8.5 09/30/2019   HGB 15.3 09/30/2019   HCT 43.4 09/30/2019   MCV 94.7 09/30/2019   PLT 260.0 09/30/2019    IMPRESSION AND PLAN:  Excessive sweating, heat intol? Suspect this is mostly environmental-related, constantly working in hot environment, suspect some autonomic response to exhaustion.  Muscle cramps, intermittent dizziness and nausea/upset stomach secondary to episodic dehydration from all his sweating. Emphasized good hydration, frequent rest in cool environment. Check cbc, cmet, thyroid panel. Obs/expectant mgmt for now.  An After Visit Summary was printed and given to the patient.  FOLLOW UP: No follow-ups on file.  Signed:  11/30/2019, MD           02/04/2020

## 2020-02-05 LAB — T3: T3, Total: 124 ng/dL (ref 76–181)

## 2020-09-08 ENCOUNTER — Other Ambulatory Visit: Payer: Self-pay

## 2020-09-08 ENCOUNTER — Ambulatory Visit (INDEPENDENT_AMBULATORY_CARE_PROVIDER_SITE_OTHER): Payer: 59 | Admitting: Orthopedic Surgery

## 2020-09-08 DIAGNOSIS — M1712 Unilateral primary osteoarthritis, left knee: Secondary | ICD-10-CM

## 2020-09-10 ENCOUNTER — Encounter: Payer: Self-pay | Admitting: Orthopedic Surgery

## 2020-09-10 DIAGNOSIS — M1712 Unilateral primary osteoarthritis, left knee: Secondary | ICD-10-CM

## 2020-09-10 MED ORDER — BUPIVACAINE HCL 0.25 % IJ SOLN
4.0000 mL | INTRAMUSCULAR | Status: AC | PRN
  Administered 2020-09-10: 4 mL via INTRA_ARTICULAR

## 2020-09-10 MED ORDER — BETAMETHASONE SOD PHOS & ACET 6 (3-3) MG/ML IJ SUSP
6.0000 mg | INTRAMUSCULAR | Status: AC | PRN
  Administered 2020-09-10: 6 mg via INTRA_ARTICULAR

## 2020-09-10 MED ORDER — LIDOCAINE HCL 1 % IJ SOLN
5.0000 mL | INTRAMUSCULAR | Status: AC | PRN
  Administered 2020-09-10: 5 mL

## 2020-09-10 NOTE — Progress Notes (Signed)
Office Visit Note   Patient: Blake Gomez           Date of Birth: [birth year]           MRN: [id number] Visit Date: 09/08/2020 Requested by: Blake Massed, MD [address] [place],  [place] [address] PCP: Blake Massed, MD  Subjective: Chief Complaint  Patient presents with  . 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  Had prior knee injection 621 with good relief until about 2 months ago.  Would like to have a repeat injection.  Reports primarily posterior pain but with no locking or clicking.  Denies any low back pain.  His symptoms are essentially the same as last year but slightly worse.  Hard for him to stand at times.  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              ROS: All systems reviewed are negative as they relate to the chief complaint within the history of present illness.  Patient denies  fevers or chills.   Assessment & Plan: Visit Diagnoses:  1. Unilateral primary osteoarthritis, left knee     Plan: Impression is left knee pain and swelling with arthritis and meniscal pathology present.  Range of motion is pretty good.  Plan repeat injection today and we will see how he does.  Come back for further work-up and management if symptoms do not improve.  Follow-Up Instructions: Return if symptoms worsen or fail to improve.   Orders:  No orders of the defined types were placed in this encounter.  No orders of the defined types were placed in this encounter.     Procedures: Large Joint Inj: L knee on 09/10/2020 10:01 PM Indications: diagnostic evaluation, joint swelling and pain Details: 18 G 1.5 in needle, superolateral approach  Arthrogram: No  Medications: 5 mL lidocaine 1 %; 4 mL bupivacaine 0.25 %; 6 mg betamethasone acetate-betamethasone sodium phosphate 6 (3-3) MG/ML Outcome: tolerated well, no immediate complications Procedure, treatment alternatives, risks and  benefits explained, specific risks discussed. Consent was given by the patient. Immediately prior to procedure a time out was called to verify the correct patient, procedure, equipment, support staff and site/side marked as required. Patient was prepped and draped in the usual sterile fashion.       Clinical Data: No additional findings.  Objective: Vital Signs: There were no vitals taken for this visit.  Physical Exam:   Constitutional: Patient appears well-developed HEENT:  Head: Normocephalic Eyes:EOM are normal Neck: Normal range of motion Cardiovascular: Normal rate Pulmonary/chest: Effort normal Neurologic: Patient is alert Skin: Skin is warm Psychiatric: Patient has normal mood and affect    Ortho Exam: Ortho exam demonstrates range of motion 5 degree flexion contracture to 120 of flexion with mild effusion medial and lateral joint line tenderness with intact extensor mechanism.  No groin pain with internal extra rotation of the leg.  No other masses lymphadenopathy or skin changes noted in that left knee region.  Specialty Comments:  No specialty comments available.  Imaging: No results found.   PMFS History: Patient Active Problem List   Diagnosis Date Noted  . Hemorrhoids 03/21/2016  . Hyperlipidemia 10/15/2008  . Acute renal failure syndrome (HCC) 10/13/2008  . Acute coronary syndrome (HCC) 10/13/2008  . Precordial pain 10/13/2008   Past Medical History:  Diagnosis Date  . Bronchitis, mucopurulent recurrent (HCC)   .  CAD (coronary artery disease)    stent 2014.  No MI.  Echo normal.  . Chronic renal insufficiency, stage 2 (mild)   . GERD (gastroesophageal reflux disease)   . Hay fever   . Hematochezia 2017; 2019   Hematochezia/screening: 2017 Colonoscopy normal except adenomatous polyp.  2019 colonoscopy, +polyp, recall 2022.  [name] History of adenomatous polyp of colon 2017; 2019   Recall 2022  . History of hematuria    per old records  .  Hypercholesterolemia   . Hypertension   . Left knee pain 2021   MRI 10/2019-> moderate DJD medial mostly, +deg tear of medial and lateral menisci, +effusion, +popliteal cyst->recommended ortho  . Obesity, Class II, BMI 35-39.9   . Unilateral primary osteoarthritis, left knee    + deg menisc tears: Dr. August Saucer, steroid inj 11/2019, pt wants to avoid surgery    Family History  Problem Relation Age of Onset  . Heart disease Mother   . Diverticulitis Mother   . Cancer Father   . Early death Brother        car accident  . Alzheimer's disease Maternal Grandmother     Past Surgical History:  Procedure Laterality Date  . CARDIAC CATHETERIZATION  09/2008   95% LAD lesion-->DES  . CARDIOVASCULAR STRESS TEST  2015   cardiolyte->no ischemia.  EF 60%.  . COLONOSCOPY  2017/2019   Hematochezia/screening: 2017 Colonoscopy normal except adenomatous polyp.  2019 colonoscopy, +polyp, recall 2022.  [name] CORONARY ANGIOPLASTY WITH STENT PLACEMENT  2010   DES to LAD   Social History   Occupational History  . Not on file  Tobacco Use  . Smoking status: Never Smoker  . Smokeless tobacco: Never Used  Vaping Use  . Vaping Use: Never used  Substance and Sexual Activity  . Alcohol use: Never  . Drug use: Never  . Sexual activity: Not on file

## 2021-03-10 IMAGING — DX DG KNEE COMPLETE 4+V*L*
4 series · 4 of 4 positions shown · non-contrast
Comparison: None.

CLINICAL DATA: Acute on chronic left knee pain, no injury.

EXAM:
LEFT KNEE - COMPLETE 4+ VIEW

[knee ap]
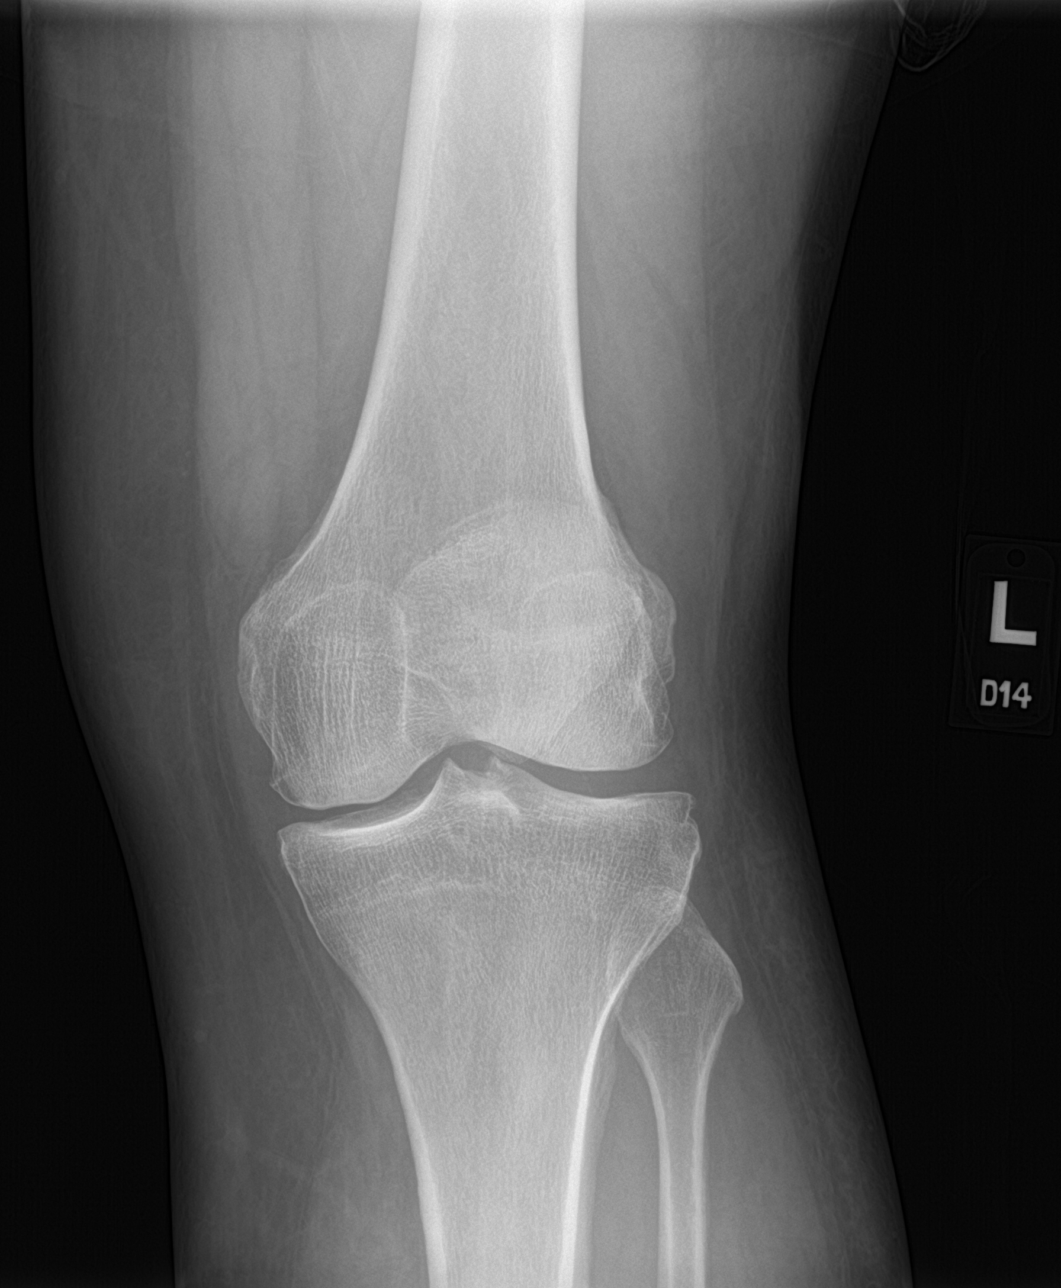

[knee lat]
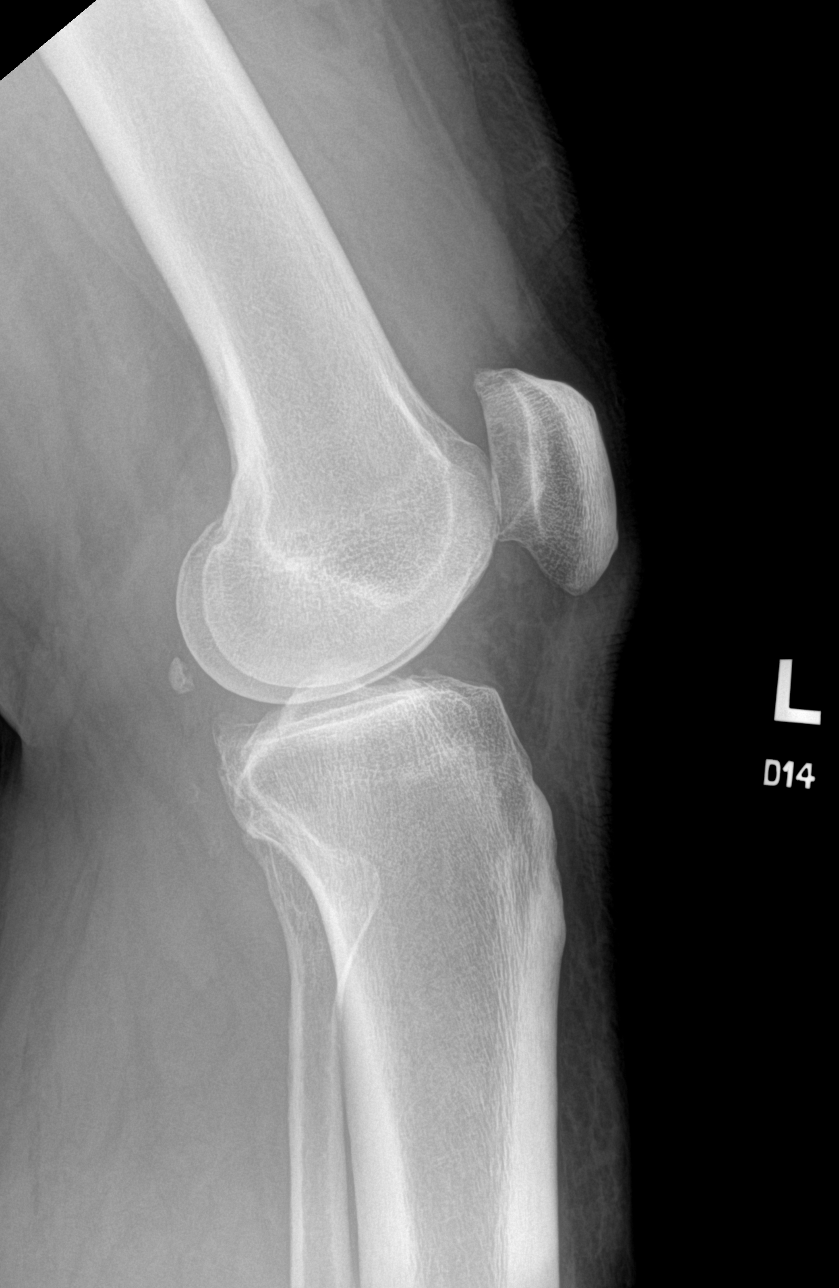

[knee obl (1 of 2)]
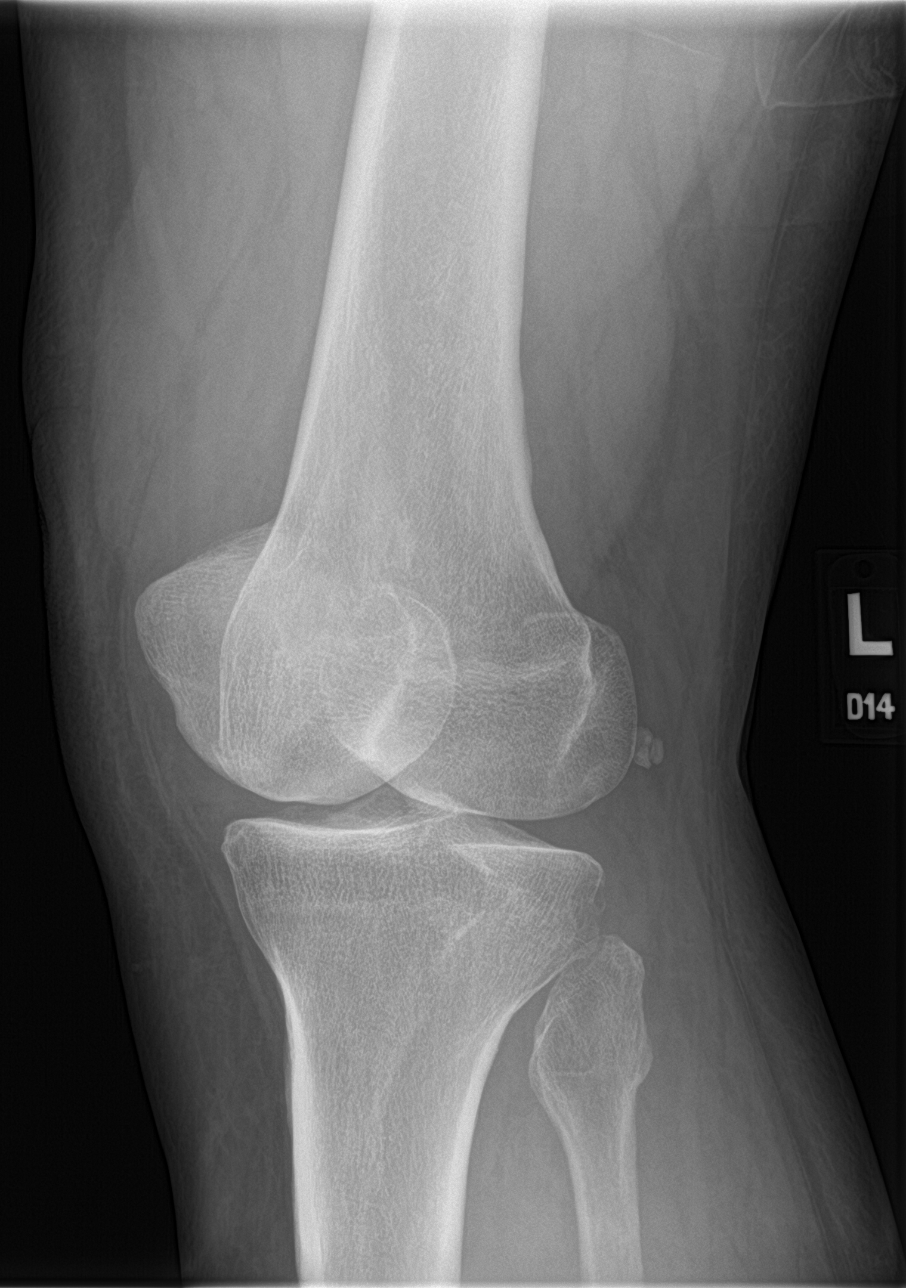

[knee obl (2 of 2)]
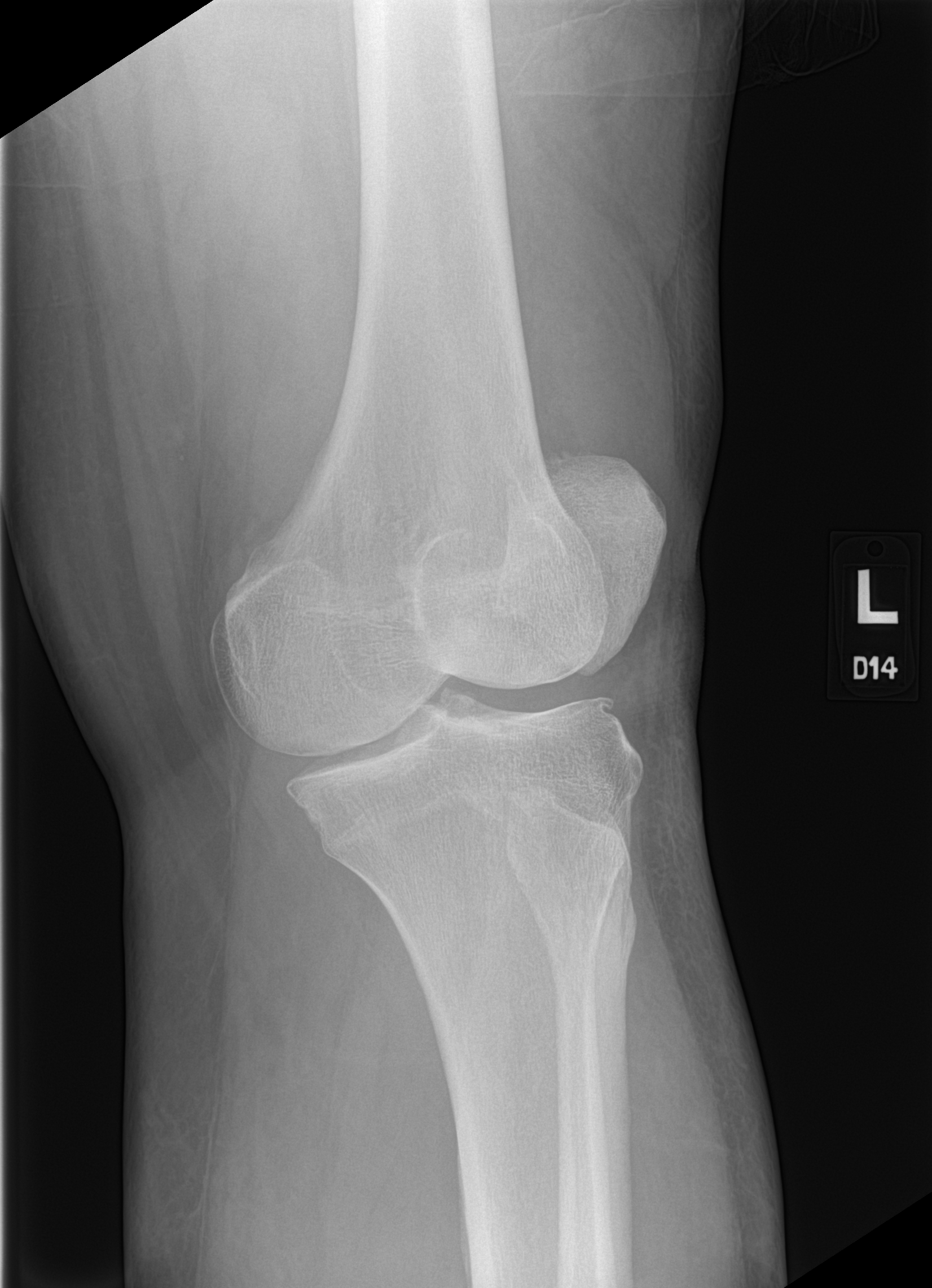

[4 of 4 positions shown; findings below may reference images not displayed]

FINDINGS: Small to moderate joint effusion. Trace tricompartment
osteophytosis. There may be chondrocalcinosis in the medial
compartment. Slight narrowing of the medial compartment. Minimal
flattening of the articular surface of the medial femoral condyle.
IMPRESSION: 1. Difficult to exclude an osteochondral defect along the articular
surface of the medial femoral condyle.
2. Small to moderate joint effusion.
3. Early/mild tricompartment osteoarthritis.

## 2021-04-23 IMAGING — MR MR KNEE*L* W/O CM
7 series · 40 of 40 positions shown · non-contrast
Comparison: Radiographs 09/30/2019

CLINICAL DATA: Diffuse knee pain with limited range of motion for 2
months. No acute injury or prior relevant surgery. Osteochondral
defect femoral condyle.

EXAM:
MRI OF THE LEFT KNEE WITHOUT CONTRAST
TECHNIQUE: Multiplanar, multisequence MR imaging of the knee was performed. No
intravenous contrast was administered.

[Series 10: T2 fat-sat · axial · 4.0mm · 0.66mm/px · z∈[-70,+64]mm · 7 of 28 slices shown (1 of 3)]
[im 1/28]
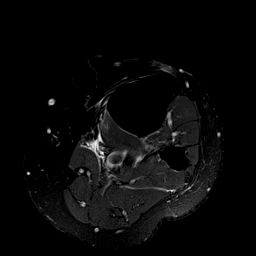
[im 5/28]
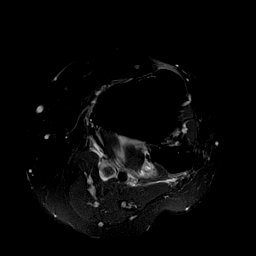
[im 10/28]
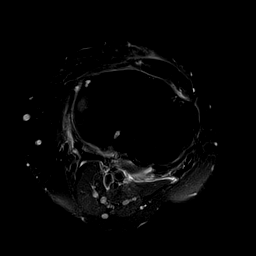
[im 14/28]
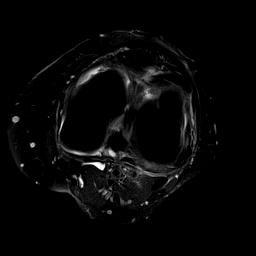
[im 19/28]
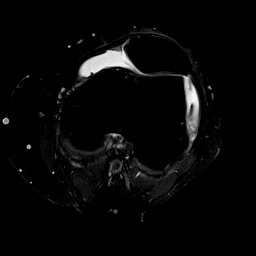
[im 23/28]
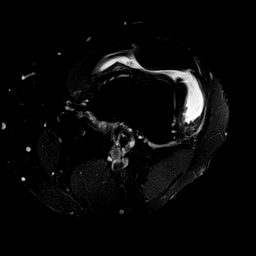
[im 28/28]
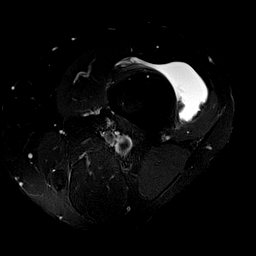

[Series 11: T1 · coronal · 4.0mm · 0.66mm/px · 5 of 25 slices shown]
[im 1/25]
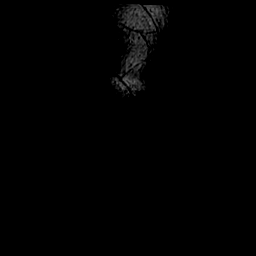
[im 7/25]
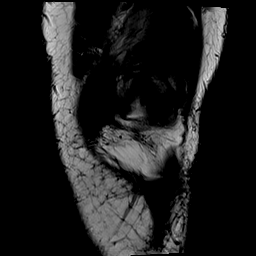
[im 13/25]
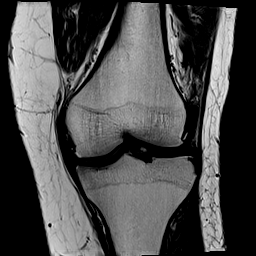
[im 19/25]
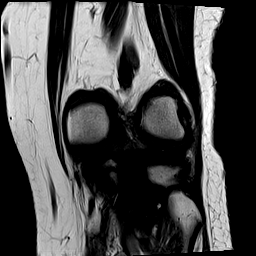
[im 25/25]
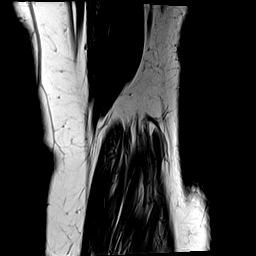

[Series 12: T2 fat-sat · coronal · 4.0mm · 0.66mm/px · 5 of 25 slices shown (2 of 3)]
[im 1/25]
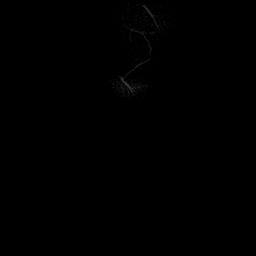
[im 7/25]
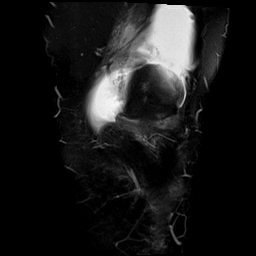
[im 13/25]
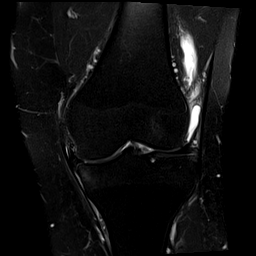
[im 19/25]
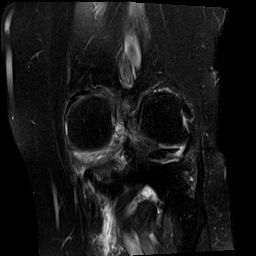
[im 25/25]
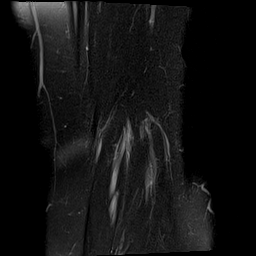

[Series 13: PD fat-sat · coronal · 3.0mm · 0.66mm/px · 7 of 32 slices shown (1 of 2)]
[im 1/32]
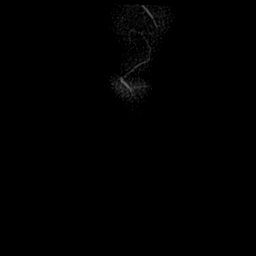
[im 6/32]
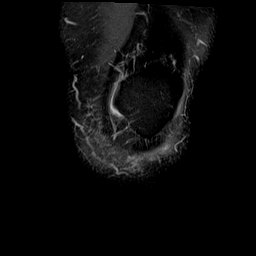
[im 11/32]
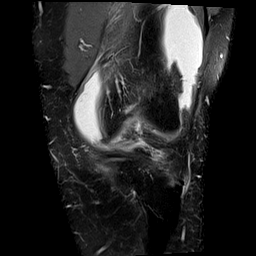
[im 16/32]
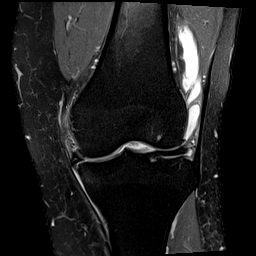
[im 21/32]
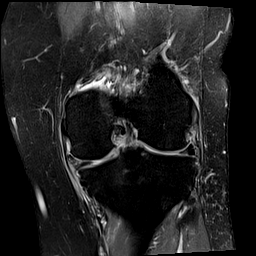
[im 26/32]
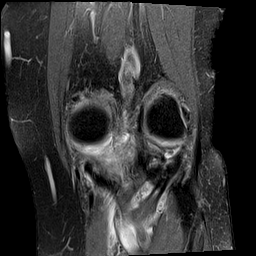
[im 32/32]
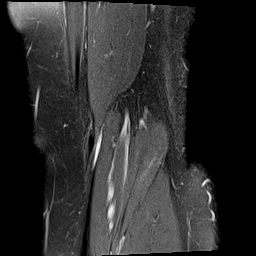

[Series 14: PD fat-sat · sagittal · 3.0mm · 0.66mm/px · 6 of 31 slices shown (2 of 2)]
[im 1/31]
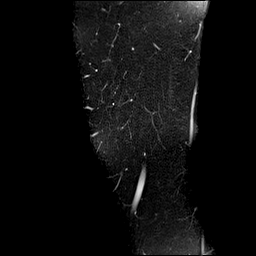
[im 7/31]
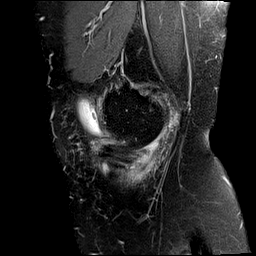
[im 13/31]
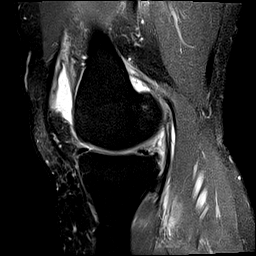
[im 19/31]
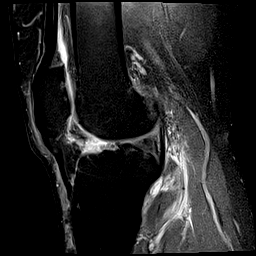
[im 25/31]
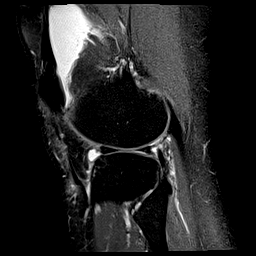
[im 31/31]
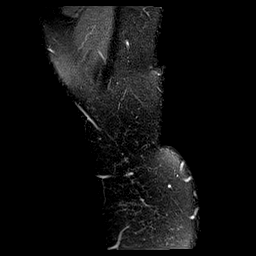

[Series 15: T2 fat-sat · sagittal · 3.0mm · 0.66mm/px · 6 of 31 slices shown (3 of 3)]
[im 1/31]
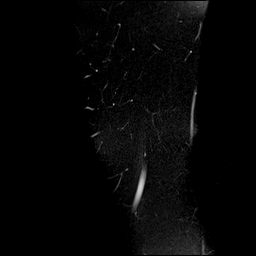
[im 7/31]
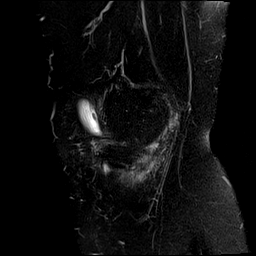
[im 13/31]
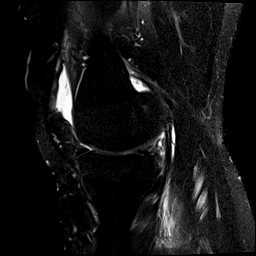
[im 19/31]
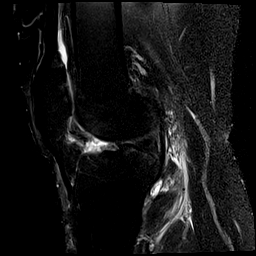
[im 25/31]
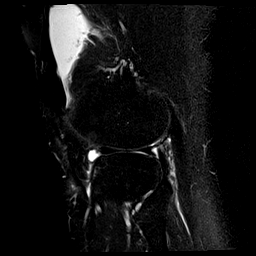
[im 31/31]
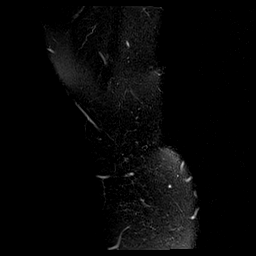

[Series 16: PD · oblique · 2.5mm · 0.62mm/px · 4 of 19 slices shown]
[im 1/19]
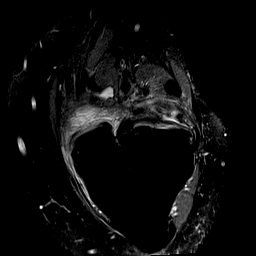
[im 7/19]
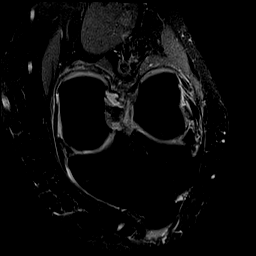
[im 13/19]
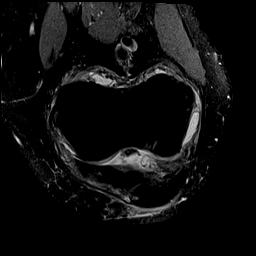
[im 19/19]
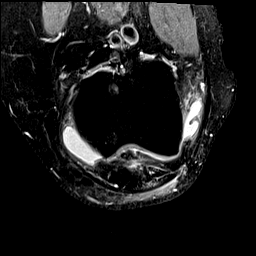

[40 of 40 positions shown; findings below may reference images not displayed]

FINDINGS: MENISCI

Medial meniscus: Extensive degenerative tearing of the posterior
horn and body. The meniscal root is intact. No centrally displaced
meniscal fragments identified.

Lateral meniscus: Small radial tear of the posterior horn. No
centrally displaced meniscal fragments.

LIGAMENTS

Cruciates:  Intact.

Collaterals:  Intact.

CARTILAGE

Patellofemoral: Mild thinning of the patellar cartilage with mild
subchondral cyst formation just lateral to the apex. Lateral
trochlear chondromalacia with associated subchondral cyst formation.

Medial: Moderate chondral thinning, surface irregularity and
osteophyte formation. There is mild subchondral edema peripherally
in the medial tibial plateau. No focal osteochondral lesion.

Lateral: Mild chondral thinning and surface irregularity
posteriorly.

MISCELLANEOUS

Joint: Moderate-sized joint effusion with mild synovial
irregularity.

Popliteal Fossa: Tiny Baker's cyst. There is complex fluid
associated with the popliteal muscle with associated synovial
irregularity and possible small loose bodies, not clearly seen on
prior radiographs. The popliteus tendon is intact.

Extensor Mechanism:  Intact.

Bones:  No acute or significant extra-articular osseous findings.

Other: No other significant periarticular soft tissue findings.
IMPRESSION: 1. Extensive degenerative tearing of the posterior horn and body of
the medial meniscus.
2. Small radial tear of the posterior horn of the lateral meniscus.
3. Tricompartmental degenerative changes, most advanced in the
medial compartment. No osteochondral lesion or acute osseous
findings.
4. Moderate-sized joint effusion and tiny Baker's cyst.
5. Complex fluid associated with the popliteal muscle with
associated synovial irregularity and possible small loose bodies,
not clearly seen on prior radiographs.

## 2021-04-24 DIAGNOSIS — J411 Mucopurulent chronic bronchitis: Secondary | ICD-10-CM

## 2021-04-24 HISTORY — DX: Mucopurulent chronic bronchitis: J41.1

## 2021-05-03 ENCOUNTER — Other Ambulatory Visit: Payer: Self-pay

## 2021-05-03 ENCOUNTER — Encounter (HOSPITAL_BASED_OUTPATIENT_CLINIC_OR_DEPARTMENT_OTHER): Payer: Self-pay | Admitting: Nurse Practitioner

## 2021-05-03 ENCOUNTER — Ambulatory Visit (HOSPITAL_BASED_OUTPATIENT_CLINIC_OR_DEPARTMENT_OTHER): Payer: BC Managed Care – PPO | Admitting: Nurse Practitioner

## 2021-05-03 VITALS — BP 124/68 | HR 66 | Ht 70.0 in | Wt 278.0 lb

## 2021-05-03 DIAGNOSIS — G8929 Other chronic pain: Secondary | ICD-10-CM

## 2021-05-03 DIAGNOSIS — J40 Bronchitis, not specified as acute or chronic: Secondary | ICD-10-CM

## 2021-05-03 DIAGNOSIS — Z860100 Personal history of colon polyps, unspecified: Secondary | ICD-10-CM | POA: Insufficient documentation

## 2021-05-03 DIAGNOSIS — N179 Acute kidney failure, unspecified: Secondary | ICD-10-CM

## 2021-05-03 DIAGNOSIS — E782 Mixed hyperlipidemia: Secondary | ICD-10-CM

## 2021-05-03 DIAGNOSIS — R21 Rash and other nonspecific skin eruption: Secondary | ICD-10-CM

## 2021-05-03 DIAGNOSIS — I249 Acute ischemic heart disease, unspecified: Secondary | ICD-10-CM | POA: Diagnosis not present

## 2021-05-03 DIAGNOSIS — I10 Essential (primary) hypertension: Secondary | ICD-10-CM | POA: Diagnosis not present

## 2021-05-03 DIAGNOSIS — Z23 Encounter for immunization: Secondary | ICD-10-CM | POA: Diagnosis not present

## 2021-05-03 DIAGNOSIS — Z125 Encounter for screening for malignant neoplasm of prostate: Secondary | ICD-10-CM

## 2021-05-03 DIAGNOSIS — M25562 Pain in left knee: Secondary | ICD-10-CM

## 2021-05-03 HISTORY — DX: Rash and other nonspecific skin eruption: R21

## 2021-05-03 HISTORY — DX: Bronchitis, not specified as acute or chronic: J40

## 2021-05-03 LAB — HEMOGLOBIN A1C
Est. average glucose Bld gHb Est-mCnc: 108 mg/dL
Hgb A1c MFr Bld: 5.4 % (ref 4.8–5.6)

## 2021-05-03 LAB — CBC WITH DIFFERENTIAL/PLATELET
Basophils Absolute: 0.1 10*3/uL (ref 0.0–0.2)
Basos: 1 %
EOS (ABSOLUTE): 0.2 10*3/uL (ref 0.0–0.4)
Eos: 2 %
Hematocrit: 45.1 % (ref 37.5–51.0)
Hemoglobin: 15.4 g/dL (ref 13.0–17.7)
Immature Grans (Abs): 0 10*3/uL (ref 0.0–0.1)
Immature Granulocytes: 0 %
Lymphocytes Absolute: 2 10*3/uL (ref 0.7–3.1)
Lymphs: 28 %
MCH: 31.7 pg (ref 26.6–33.0)
MCHC: 34.1 g/dL (ref 31.5–35.7)
MCV: 93 fL (ref 79–97)
Monocytes Absolute: 0.5 10*3/uL (ref 0.1–0.9)
Monocytes: 7 %
Neutrophils Absolute: 4.4 10*3/uL (ref 1.4–7.0)
Neutrophils: 62 %
Platelets: 289 10*3/uL (ref 150–450)
RBC: 4.86 x10E6/uL (ref 4.14–5.80)
RDW: 12.1 % (ref 11.6–15.4)
WBC: 7.2 10*3/uL (ref 3.4–10.8)

## 2021-05-03 LAB — LIPID PANEL
Chol/HDL Ratio: 6 ratio — ABNORMAL HIGH (ref 0.0–5.0)
Cholesterol, Total: 205 mg/dL — ABNORMAL HIGH (ref 100–199)
HDL: 34 mg/dL — ABNORMAL LOW (ref >39)
LDL Chol Calc (NIH): 139 mg/dL — ABNORMAL HIGH (ref 0–99)
Triglycerides: 176 mg/dL — ABNORMAL HIGH (ref 0–149)
VLDL Cholesterol Cal: 32 mg/dL (ref 5–40)

## 2021-05-03 LAB — COMPREHENSIVE METABOLIC PANEL
ALT: 23 IU/L (ref 0–44)
AST: 23 IU/L (ref 0–40)
Albumin/Globulin Ratio: 1.8 (ref 1.2–2.2)
Albumin: 4.4 g/dL (ref 3.8–4.9)
Alkaline Phosphatase: 106 IU/L (ref 44–121)
BUN/Creatinine Ratio: 17 (ref 9–20)
BUN: 17 mg/dL (ref 6–24)
Bilirubin Total: 0.5 mg/dL (ref 0.0–1.2)
CO2: 22 mmol/L (ref 20–29)
Calcium: 9 mg/dL (ref 8.7–10.2)
Chloride: 102 mmol/L (ref 96–106)
Creatinine, Ser: 1.01 mg/dL (ref 0.76–1.27)
Globulin, Total: 2.4 g/dL (ref 1.5–4.5)
Glucose: 96 mg/dL (ref 70–99)
Potassium: 4.7 mmol/L (ref 3.5–5.2)
Sodium: 139 mmol/L (ref 134–144)
Total Protein: 6.8 g/dL (ref 6.0–8.5)
eGFR: 86 mL/min/{1.73_m2} (ref >59)

## 2021-05-03 MED ORDER — NITROGLYCERIN 0.4 MG SL SUBL: 0.4000 mg | SUBLINGUAL_TABLET | SUBLINGUAL | 3 refills | Status: DC | PRN

## 2021-05-03 MED ORDER — ASPIRIN 81 MG PO TBEC: 81.0000 mg | DELAYED_RELEASE_TABLET | Freq: Every day | ORAL | 3 refills | Status: AC

## 2021-05-03 MED ORDER — PREDNISONE 20 MG PO TABS: 40.0000 mg | ORAL_TABLET | Freq: Every day | ORAL | 0 refills | Status: DC

## 2021-05-03 MED ORDER — KETOCONAZOLE 2 % EX SHAM: 1.0000 "application " | MEDICATED_SHAMPOO | CUTANEOUS | 0 refills | Status: AC

## 2021-05-03 MED ORDER — CLOPIDOGREL BISULFATE 75 MG PO TABS: ORAL_TABLET | ORAL | 3 refills | Status: DC

## 2021-05-03 MED ORDER — BENZONATATE 100 MG PO CAPS: 100.0000 mg | ORAL_CAPSULE | Freq: Three times a day (TID) | ORAL | 3 refills | Status: DC | PRN

## 2021-05-03 MED ORDER — CLOTRIMAZOLE-BETAMETHASONE 1-0.05 % EX CREA: 1.0000 "application " | TOPICAL_CREAM | Freq: Every day | CUTANEOUS | 3 refills | Status: DC

## 2021-05-03 MED ORDER — ATORVASTATIN CALCIUM 80 MG PO TABS: ORAL_TABLET | ORAL | 3 refills | Status: DC

## 2021-05-03 MED ORDER — ZOSTER VAC RECOMB ADJUVANTED 50 MCG/0.5ML IM SUSR: 0.5000 mL | Freq: Once | INTRAMUSCULAR | 1 refills | Status: AC

## 2021-05-03 MED ORDER — METOPROLOL SUCCINATE ER 25 MG PO TB24: ORAL_TABLET | ORAL | 3 refills | Status: DC

## 2021-05-03 NOTE — Assessment & Plan Note (Signed)
Will monitor labs today for further evaluation.  No changes in plan of care at this time

## 2021-05-03 NOTE — Assessment & Plan Note (Signed)
BP well controlled today Will obtain updated labs Medications refilled Cardiology referral placed Will follow labs for updated recommendations

## 2021-05-03 NOTE — Assessment & Plan Note (Signed)
History of osteoarthritis with lateral and medial meniscal tears and popliteal cyst.  Patient has undergone injections into joint  At this time he would like to consider surgical intervention due to ongoing pain.  Will send referral for evaluation and recommendations.

## 2021-05-03 NOTE — Assessment & Plan Note (Signed)
Symptoms and presentation consistent with bronchitis Unlikely infectious etiology at this time given lack of fever or associated symptoms Will trial steroid burst and tessalon for wheezing and cough Patient encouraged to contact the office if symptoms persist or worsen He declined albuterol inhaler at this time as this has caused tachycardia in the past.

## 2021-05-03 NOTE — Patient Instructions (Addendum)
Thank you for choosing [place] at [facility] for your Primary Care needs. I am excited for the opportunity to partner with you to meet your health care goals. It was a pleasure meeting you today!  Recommendations from today's visit: Apply the cream to the areas of the rash once a day until this resolves.   Wash the areas of the body with the rash once a day for one week then twice a week until the rash resolves. If you notice no improvement after 2 weeks, please let me know and we can consider oral medication treatment I have sent the referral to cardiology for evaluation of your leg pain and previous heart condition . I have sent the referral to orthopedics for your knee I have sent the referral to GI for colonoscopy follow-up Your exam looked good today- we will get some labs and let you know how things look.  I sent your refills to [facility] [place] on diet, exercise, and health maintenance recommendations are listed below. This is information to help you be sure you are on track for optimal health and monitoring.   Please look over this and let us know if you have any questions or if you have completed any of the health maintenance outside of [place] so that we can be sure your records are up to date.  ___________________________________________________________ About Me: I am an Adult-Geriatric Nurse Practitioner with a background in caring for patients for more than 20 years with a strong intensive care background. I provide primary care and sports medicine services to patients age 72 and older within this office. My education had a strong focus on caring for the older adult population, which I am passionate about. I am also the director of the APP Fellowship with [facility].   My desire is to provide you with the best service through preventive medicine and supportive care. I consider you a part of the medical team and value your input. I work  diligently to ensure that you are heard and your needs are met in a safe and effective manner. I want you to feel comfortable with me as your provider and want you to know that your health concerns are important to me.  For your information, our office hours are: Monday, Tuesday, and Thursday 8:00 AM - 5:00 PM Wednesday and Friday 8:00 AM - 12:00 PM.   In my time away from the office I am teaching new APP's within the system and am unavailable, but my partner, Dr. Burnard Bunting is in the office for emergent needs.   If you have questions or concerns, please call our office at [phone] or send [place] a MyChart message and we will respond as quickly as possible.  ____________________________________________________________ MyChart:  For all urgent or time sensitive needs we ask that you please call the office to avoid delays. Our number is [phone]. MyChart is not constantly monitored and due to the large volume of messages a day, replies may take up to 72 business hours.  MyChart Policy: MyChart allows for you to see your visit notes, after visit summary, provider recommendations, lab and tests results, make an appointment, request refills, and contact your provider or the office for non-urgent questions or concerns. Providers are seeing patients during normal business hours and do not have built in time to review MyChart messages.  We ask that you allow a minimum of 3 business days for responses to [employer]. For this reason,  please do not send urgent requests through [place]. Please call the office at [phone]. New and ongoing conditions may require a visit. We have virtual and in person visit available for your convenience.  Complex MyChart concerns may require a visit. Your provider may request you schedule a virtual or in person visit to ensure we are providing the best care possible. MyChart messages sent after 11:00 AM on Friday will not be received by the provider until Monday  morning.    Lab and Test Results: You will receive your lab and test results on MyChart as soon as they are completed and results have been sent by the lab or testing facility. Due to this service, you will receive your results BEFORE your provider.  I review lab and tests results each morning prior to seeing patients. Some results require collaboration with other providers to ensure you are receiving the most appropriate care. For this reason, we ask that you please allow a minimum of 3-5 business days from the time the ALL results have been received for your provider to receive and review lab and test results and contact you about these.  Most lab and test result comments from the provider will be sent through [place]. Your provider may recommend changes to the plan of care, follow-up visits, repeat testing, ask questions, or request an office visit to discuss these results. You may reply directly to this message or call the office at [phone] to provide information for the provider or set up an appointment. In some instances, you will be called with test results and recommendations. Please let us know if this is preferred and we will make note of this in your chart to provide this for you.    If you have not heard a response to your lab or test results in 5 business days from all results returning to [place], please call the office to let us know. We ask that you please avoid calling prior to this time unless there is an emergent concern. Due to high call volumes, this can delay the resulting process.  After Hours: For all non-emergency after hours needs, please call the office at [phone] and select the option to reach the on-call provider service. On-call services are shared between multiple [place] offices and therefore it will not be possible to speak directly with your provider. On-call providers may provide medical advice and recommendations, but are unable to provide refills for  maintenance medications.  For all emergency or urgent medical needs after normal business hours, we recommend that you seek care at the closest Urgent Care or Emergency Department to ensure appropriate treatment in a timely manner.  [facility] has a 24 hour emergency room located on the ground floor for your convenience.   Urgent Concerns During the Business Day Providers are seeing patients from 8AM to [place] with a busy schedule and are most often not able to respond to non-urgent calls until the end of the day or the next business day. If you should have URGENT concerns during the day, please call and speak to the nurse or schedule a same day appointment so that we can address your concern without delay.   Thank you, again, for choosing me as your health care partner. I appreciate your trust and look forward to learning more about you.   [name], DNP, AGNP-c ___________________________________________________________  Health Maintenance Recommendations Screening Testing Mammogram Every 1 -2 years based on history and risk factors Starting at age  40 Pap Smear Ages 21-39 every 3 years Ages 40-65 every 5 years with HPV testing More frequent testing may be required based on results and history Colon Cancer Screening Every 1-10 years based on test performed, risk factors, and history Starting at age 37 Bone Density Screening Every 2-10 years based on history Starting at age 64 for women Recommendations for men differ based on medication usage, history, and risk factors AAA Screening One time ultrasound Men 74-34 years old who have every smoked Lung Cancer Screening Low Dose Lung CT every 12 months Age 66-80 years with a 30 pack-year smoking history who still smoke or who have quit within the last 15 years  Screening Labs Routine  Labs: Complete Blood Count (CBC), Complete Metabolic Panel (CMP), Cholesterol (Lipid Panel) Every 6-12 months based on  history and medications May be recommended more frequently based on current conditions or previous results Hemoglobin A1c Lab Every 3-12 months based on history and previous results Starting at age 23 or earlier with diagnosis of diabetes, high cholesterol, BMI >26, and/or risk factors Frequent monitoring for patients with diabetes to ensure blood sugar control Thyroid Panel (TSH w/ T3 & T4) Every 6 months based on history, symptoms, and risk factors May be repeated more often if on medication HIV One time testing for all patients 54 and older May be repeated more frequently for patients with increased risk factors or exposure Hepatitis C One time testing for all patients 8 and older May be repeated more frequently for patients with increased risk factors or exposure Gonorrhea, Chlamydia Every 12 months for all sexually active persons 13-24 years Additional monitoring may be recommended for those who are considered high risk or who have symptoms PSA Men 7-63 years old with risk factors Additional screening may be recommended from age 41-69 based on risk factors, symptoms, and history  Vaccine Recommendations Tetanus Booster All adults every 10 years Flu Vaccine All patients 6 months and older every year COVID Vaccine All patients 12 years and older Initial dosing with booster May recommend additional booster based on age and health history HPV Vaccine 2 doses all patients age 71-26 Dosing may be considered for patients over 26 Shingles Vaccine (Shingrix) 2 doses all adults 32 years and older Pneumonia (Pneumovax 23) All adults 12 years and older May recommend earlier dosing based on health history Pneumonia (Prevnar 33) All adults 32 years and older Dosed 1 year after Pneumovax 23  Additional Screening, Testing, and Vaccinations may be recommended on an individualized basis based on family history, health history, risk factors, and/or exposure.   __________________________________________________________  Diet Recommendations for All Patients  I recommend that all patients maintain a diet low in saturated fats, carbohydrates, and cholesterol. While this can be challenging at first, it is not impossible and small changes can make big differences.  Things to try: Decreasing the amount of soda, sweet tea, and/or juice to one or less per day and replace with water While water is always the first choice, if you do not like water you may consider adding a water additive without sugar to improve the taste other sugar free drinks Replace potatoes with a brightly colored vegetable at dinner Use healthy oils, such as canola oil or olive oil, instead of butter or hard margarine Limit your bread intake to two pieces or less a day Replace regular pasta with low carb pasta options Bake, broil, or grill foods instead of frying Monitor portion sizes  Eat smaller, more frequent meals throughout the  day instead of large meals  An important thing to remember is, if you love foods that are not great for your health, you don't have to give them up completely. Instead, allow these foods to be a reward when you have done well. Allowing yourself to still have special treats every once in a while is a nice way to tell yourself thank you for working hard to keep yourself healthy.   Also remember that every day is a new day. If you have a bad day and "fall off the wagon", you can still climb right back up and keep moving along on your journey!  We have resources available to help you!  Some websites that may be helpful include: www.http://carter.biz/  Www.VeryWellFit.com _____________________________________________________________  Activity Recommendations for All Patients  I recommend that all adults get at least 20 minutes of moderate physical activity that elevates your heart rate at least 5 days out of the week.  Some examples include: Walking or  jogging at a pace that allows you to carry on a conversation Cycling (stationary bike or outdoors) Water aerobics Yoga Weight lifting Dancing If physical limitations prevent you from putting stress on your joints, exercise in a pool or seated in a chair are excellent options.  Do determine your MAXIMUM heart rate for activity: YOUR AGE - 220 = MAX HeartRate   Remember! Do not push yourself too hard.  Start slowly and build up your pace, speed, weight, time in exercise, etc.  Allow your body to rest between exercise and get good sleep. You will need more water than normal when you are exerting yourself. Do not wait until you are thirsty to drink. Drink with a purpose of getting in at least 8, 8 ounce glasses of water a day plus more depending on how much you exercise and sweat.    If you begin to develop dizziness, chest pain, abdominal pain, jaw pain, shortness of breath, headache, vision changes, lightheadedness, or other concerning symptoms, stop the activity and allow your body to rest. If your symptoms are severe, seek emergency evaluation immediately. If your symptoms are concerning, but not severe, please let us know so that we can recommend further evaluation.

## 2021-05-03 NOTE — Assessment & Plan Note (Signed)
Last colonoscopy in 2016- recommendation for repeat/follow-up in 4-5 years.  Will send referral today for further evaluation No symptoms present at this time.

## 2021-05-03 NOTE — Assessment & Plan Note (Signed)
On statin therapy at this time Will obtain labs today for monitoring and make changes to plan of care as necessary.

## 2021-05-03 NOTE — Assessment & Plan Note (Signed)
History of CAD with ACS No recent labs or f/u with cardiology- will send referral and obtain labs today Suspect LE pain likely related to vascular deficiency/athrosclerosis. No hx of DM, but will obtain labs today. Recommend continue medications- refills provided Will follow labs for updated recommendations.

## 2021-05-03 NOTE — Assessment & Plan Note (Signed)
Rash consistent with fungal etiology- appearance consistent with Majocchi's granuloma  Likely related to work in warm, moist, dark environments.  Recommend treatment with ketoconazole shampoo once daily for 1 week then twice a week and application of Lotrisone cream daily to affected areas.  Patient will let me know if symptoms do not improve in 1 week.  At that time can consider oral treatment if labs permit.

## 2021-05-03 NOTE — Progress Notes (Signed)
Orma Render, DNP, AGNP-c Primary Care & Sports Medicine [address]  [place], [place] [address] [phone]  New patient visit   Patient: Blake Gomez   DOB: Sep 21, 1960   60 y.o. Male  MRN: [id number] Visit Date: 05/03/2021  Patient Care Team: Sally-Ann Cutbirth, Coralee Pesa, NP as PCP - General (Nurse Practitioner) Richardo Priest, MD as Consulting Physician (Cardiology)  Today's healthcare provider: Orma Render, NP   No chief complaint on file.  Subjective    Blake Gomez is a 60 y.o. male who presents today as a new patient to establish care. He is here with his wife, Blake Gomez, today. HPI  Blake Gomez has not been seen in by primary care in several years.  Blake Gomez has the following concerns: Rash on left arm and shoulder Present for about 2 months Works as a Development worker, community and unsure if he has been in contact with something that could have caused Red, itchy, and will occasionally have clear pustules Has spread from L arm to shoulder, also noted area on right neck No pain, tenderness, swelling, warmth, fevers, GI distress present.  No changes in soap, lotion, detergent. He has been using OTC anti-itch cream with no change  Burning in lower extremities Endorses burning, numbness, tingling in LE bilaterally Hx of CAD with stent placement several years ago No f/u with cards in >2y No known DM No back pain or radiation down legs or thighs No warmth, tenderness present.  Left knee pain Chronic pain - no acute changes recently Has been told in the past he will need a replacement- meniscal tears with degenerative arthritis present Works as a Development worker, community and spends majority of his time on his knees.  Unable to stand up from kneeling without significant pain.   Cough History of bronchitis Recent coughing, congestion, purulent mucous production, and wheezing.  No fever, chills, BA, ST Symptoms present for the past 2-3 weeks.   Past Medical History:  Diagnosis Date    Bronchitis, mucopurulent recurrent ([place])    CAD (coronary artery disease)    stent 2014.  No MI.  Echo normal.   Chronic renal insufficiency, stage 2 (mild)    GERD (gastroesophageal reflux disease)    Hay fever    Hematochezia 2017; 2019   Hematochezia/screening: 2017 Colonoscopy normal except adenomatous polyp.  2019 colonoscopy, +polyp, recall 2022.   History of adenomatous polyp of colon 2017; 2019   Recall 2022   History of hematuria    per old records   Hypercholesterolemia    Hypertension    Left knee pain 2021   MRI 10/2019-> moderate DJD medial mostly, +deg tear of medial and lateral menisci, +effusion, +popliteal cyst->recommended ortho   Obesity, Class II, BMI 35-39.9    Unilateral primary osteoarthritis, left knee    + deg menisc tears: Dr. Marlou Sa, steroid inj 11/2019, pt wants to avoid surgery   Past Surgical History:  Procedure Laterality Date   CARDIAC CATHETERIZATION  09/2008   95% LAD lesion-->DES   CARDIOVASCULAR STRESS TEST  2015   cardiolyte->no ischemia.  EF 60%.   COLONOSCOPY  2017/2019   Hematochezia/screening: 2017 Colonoscopy normal except adenomatous polyp.  2019 colonoscopy, +polyp, recall 2022.   CORONARY ANGIOPLASTY WITH STENT PLACEMENT  2010   DES to LAD   Family Status  Relation Name Status   Mother  Alive   Father  Deceased   Brother Blake Gomez Deceased   MGM  Deceased   MGF  Deceased   PGM  Deceased   PGF  Deceased   Family History  Problem Relation Age of Onset   Heart disease Mother    Diverticulitis Mother    Cancer Father    Blake Gomez death Brother        car accident   Alzheimer's disease Maternal Grandmother    Social History   Socioeconomic History   Marital status: Married    Spouse name: Not on file   Number of children: Not on file   Years of education: Not on file   Highest education level: Not on file  Occupational History   Not on file  Tobacco Use   Smoking status: Never   Smokeless tobacco: Never  Vaping Use   Vaping  Use: Never used  Substance and Sexual Activity   Alcohol use: Never   Drug use: Never   Sexual activity: Not on file  Other Topics Concern   Not on file  Social History Narrative   Married, wife Blake Gomez.  No children.   Relocated from [facility] b/c lost job due to covid.   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   Lives on [address]   No T/A/Ds.   Social Determinants of Health   Financial Resource Strain: Not on file  Food Insecurity: Not on file  Transportation Needs: Not on file  Physical Activity: Not on file  Stress: Not on file  Social Connections: Not on file   Outpatient Medications Prior to Visit  Medication Sig   diclofenac Sodium (VOLTAREN) 1 % GEL Apply 4 g topically 4 (four) times daily.   Fish Oil-Krill Oil (MEGARED ADVANCED 4 IN 1 PO) Take by mouth daily.   folic acid (FOLVITE) 800 MCG tablet folic acid 800 mcg tablet  Take 1 tablet every day by oral route.   Multiple Vitamins-Minerals (CENTRUM SILVER PO) Centrum Silver Men 300 mcg-600 mcg-300 mcg tablet  Take 1 tablet every day by oral route.   [DISCONTINUED] aspirin 81 MG EC tablet Adult Low Dose Aspirin 81 mg tablet,delayed release  Take 1 tablet every day by oral route.   [DISCONTINUED] atorvastatin (LIPITOR) 80 MG tablet atorvastatin 80 mg tablet   [DISCONTINUED] clopidogrel (PLAVIX) 75 MG tablet clopidogrel 75 mg tablet   [DISCONTINUED] metoprolol succinate (TOPROL-XL) 25 MG 24 hr tablet metoprolol succinate ER 25 mg tablet,extended release 24 hr  TAKE ONE TABLET BY MOUTH ONCE DAILY.   [DISCONTINUED] nitroGLYCERIN (NITROSTAT) 0.4 MG SL tablet Place 1 tablet (0.4 mg total) under the tongue every 5 (five) minutes as needed for chest pain.   No facility-administered medications prior to visit.   No Known Allergies  Immunization History  Administered Date(s) Administered   PNEUMOCOCCAL CONJUGATE-20 05/03/2021   Td 05/03/2021   Tdap 06/25/2007    Health Maintenance  Topic Date  Due   COVID-19 Vaccine (1) Never done   HIV Screening  Never done   Hepatitis C Screening  Never done   Zoster Vaccines- Shingrix (1 of 2) Never done   INFLUENZA VACCINE  03/25/2022 (Originally 01/22/2021)   COLONOSCOPY (Pts 45-74yrs Insurance coverage will need to be confirmed)  04/18/2026   TETANUS/TDAP  05/04/2031   Pneumococcal Vaccine 64-58 Years old  Aged Out   HPV VACCINES  Aged Out    Patient Care Team: Margo Lama, Sung Amabile, NP as PCP - General (Nurse Practitioner) Baldo Daub, MD as Consulting Physician (Cardiology)  Review of Systems All review of systems negative except what is listed in the HPI  Objective    There were no vitals taken for this visit. Physical Exam Vitals and nursing note reviewed.  Constitutional:      Appearance: Normal appearance. He is obese.  HENT:     Head: Normocephalic.  Eyes:     Extraocular Movements: Extraocular movements intact.     Conjunctiva/sclera: Conjunctivae normal.     Pupils: Pupils are equal, round, and reactive to light.  Neck:     Vascular: No carotid bruit.  Cardiovascular:     Rate and Rhythm: Normal rate and regular rhythm.     Pulses: Normal pulses.     Heart sounds: Normal heart sounds.  Pulmonary:     Effort: Pulmonary effort is normal.     Breath sounds: Wheezing present.  Abdominal:     General: Bowel sounds are normal.     Palpations: Abdomen is soft.  Musculoskeletal:        General: Tenderness present.     Cervical back: Normal range of motion. No tenderness.     Right lower leg: No edema.     Left lower leg: No edema.     Comments: Left knee tenderness with movement.   Lymphadenopathy:     Cervical: No cervical adenopathy.  Skin:    General: Skin is warm and dry.  Neurological:     General: No focal deficit present.     Mental Status: He is alert and oriented to person, place, and time.  Psychiatric:        Mood and Affect: Mood normal.        Behavior: Behavior normal.        Thought Content:  Thought content normal.        Judgment: Judgment normal.     Depression Screen PHQ 2/9 Scores 05/03/2021 09/30/2019  PHQ - 2 Score 0 0  PHQ- 9 Score 0 -   No results found for any visits on 05/03/21.  Assessment & Plan      Problem List Items Addressed This Visit     Hyperlipidemia - Primary    On statin therapy at this time Will obtain labs today for monitoring and make changes to plan of care as necessary.       Relevant Medications   nitroGLYCERIN (NITROSTAT) 0.4 MG SL tablet   metoprolol succinate (TOPROL-XL) 25 MG 24 hr tablet   clopidogrel (PLAVIX) 75 MG tablet   atorvastatin (LIPITOR) 80 MG tablet   aspirin 81 MG EC tablet   Other Relevant Orders   Ambulatory referral to Cardiology   CBC with Differential/Platelet   Comprehensive metabolic panel   Lipid panel   Hemoglobin A1c   Acute renal failure syndrome ([place])    Will monitor labs today for further evaluation.  No changes in plan of care at this time      Relevant Medications   metoprolol succinate (TOPROL-XL) 25 MG 24 hr tablet   clopidogrel (PLAVIX) 75 MG tablet   atorvastatin (LIPITOR) 80 MG tablet   aspirin 81 MG EC tablet   Other Relevant Orders   CBC with Differential/Platelet   Comprehensive metabolic panel   Lipid panel   Hemoglobin A1c   Acute coronary syndrome (HCC)    History of CAD with ACS No recent labs or f/u with cardiology- will send referral and obtain labs today Suspect LE pain likely related to vascular deficiency/athrosclerosis. No hx of DM, but will obtain labs today. Recommend continue medications- refills provided Will follow labs for updated recommendations.  Relevant Medications   nitroGLYCERIN (NITROSTAT) 0.4 MG SL tablet   metoprolol succinate (TOPROL-XL) 25 MG 24 hr tablet   clopidogrel (PLAVIX) 75 MG tablet   atorvastatin (LIPITOR) 80 MG tablet   aspirin 81 MG EC tablet   Other Relevant Orders   Ambulatory referral to Cardiology   CBC with  Differential/Platelet   Comprehensive metabolic panel   Lipid panel   Hemoglobin A1c   Hypertension    BP well controlled today Will obtain updated labs Medications refilled Cardiology referral placed Will follow labs for updated recommendations      Relevant Medications   nitroGLYCERIN (NITROSTAT) 0.4 MG SL tablet   metoprolol succinate (TOPROL-XL) 25 MG 24 hr tablet   clopidogrel (PLAVIX) 75 MG tablet   atorvastatin (LIPITOR) 80 MG tablet   aspirin 81 MG EC tablet   Other Relevant Orders   Ambulatory referral to Cardiology   CBC with Differential/Platelet   Comprehensive metabolic panel   Lipid panel   Hemoglobin A1c   Skin rash    Rash consistent with fungal etiology- appearance consistent with  Majocchi's granuloma  Likely related to work in warm, moist, dark environments.  Recommend treatment with ketoconazole shampoo once daily for 1 week then twice a week and application of Lotrisone cream daily to affected areas.  Patient will let me know if symptoms do not improve in 1 week.  At that time can consider oral treatment if labs permit.       Relevant Medications   clotrimazole-betamethasone (LOTRISONE) cream   ketoconazole (NIZORAL) 2 % shampoo   History of colon polyps    Last colonoscopy in 2016- recommendation for repeat/follow-up in 4-5 years.  Will send referral today for further evaluation No symptoms present at this time.       Relevant Orders   Ambulatory referral to Gastroenterology   Chronic pain of left knee    History of osteoarthritis with lateral and medial meniscal tears and popliteal cyst.  Patient has undergone injections into joint  At this time he would like to consider surgical intervention due to ongoing pain.  Will send referral for evaluation and recommendations.       Relevant Medications   aspirin 81 MG EC tablet   predniSONE (DELTASONE) 20 MG tablet   Other Relevant Orders   AMB referral to orthopedics   Bronchitis    Symptoms and  presentation consistent with bronchitis Unlikely infectious etiology at this time given lack of fever or associated symptoms Will trial steroid burst and tessalon for wheezing and cough Patient encouraged to contact the office if symptoms persist or worsen He declined albuterol inhaler at this time as this has caused tachycardia in the past.       Relevant Medications   predniSONE (DELTASONE) 20 MG tablet   benzonatate (TESSALON) 100 MG capsule   Other Relevant Orders   CBC with Differential/Platelet   Other Visit Diagnoses     Prostate cancer screening       Relevant Orders   PSA Total (Reflex To Free)   Encounter for immunization       Relevant Orders   Pneumococcal conjugate vaccine 20-valent (Prevnar 20) (Completed)   Td : Tetanus/diphtheria >7yo Preservative  free (Completed)        Return in about 3 months (around 08/03/2021) for CPE .      Edita Weyenberg, Coralee Pesa, NP, DNP, AGNP-C Primary Care & Sports Medicine at [place]

## 2021-05-04 LAB — PSA TOTAL (REFLEX TO FREE): Prostate Specific Ag, Serum: 0.5 ng/mL (ref 0.0–4.0)

## 2021-05-07 ENCOUNTER — Ambulatory Visit (HOSPITAL_BASED_OUTPATIENT_CLINIC_OR_DEPARTMENT_OTHER): Payer: BC Managed Care – PPO | Admitting: Orthopaedic Surgery

## 2021-05-07 ENCOUNTER — Ambulatory Visit (HOSPITAL_BASED_OUTPATIENT_CLINIC_OR_DEPARTMENT_OTHER)
Admission: RE | Admit: 2021-05-07 | Discharge: 2021-05-07 | Disposition: A | Discharge status: Home / Self Care | Payer: BC Managed Care – PPO | Source: Ambulatory Visit | Attending: Orthopaedic Surgery | Admitting: Orthopaedic Surgery

## 2021-05-07 ENCOUNTER — Other Ambulatory Visit: Payer: Self-pay

## 2021-05-07 ENCOUNTER — Other Ambulatory Visit (HOSPITAL_BASED_OUTPATIENT_CLINIC_OR_DEPARTMENT_OTHER): Payer: Self-pay

## 2021-05-07 ENCOUNTER — Other Ambulatory Visit (HOSPITAL_BASED_OUTPATIENT_CLINIC_OR_DEPARTMENT_OTHER): Payer: Self-pay | Admitting: Orthopaedic Surgery

## 2021-05-07 ENCOUNTER — Ambulatory Visit (HOSPITAL_BASED_OUTPATIENT_CLINIC_OR_DEPARTMENT_OTHER): Payer: Self-pay | Admitting: Orthopaedic Surgery

## 2021-05-07 DIAGNOSIS — G8929 Other chronic pain: Secondary | ICD-10-CM

## 2021-05-07 DIAGNOSIS — M25562 Pain in left knee: Secondary | ICD-10-CM | POA: Diagnosis not present

## 2021-05-07 DIAGNOSIS — S83207A Unspecified tear of unspecified meniscus, current injury, left knee, initial encounter: Secondary | ICD-10-CM

## 2021-05-07 MED ORDER — ACETAMINOPHEN 500 MG PO TABS
500.0000 mg | ORAL_TABLET | Freq: Three times a day (TID) | ORAL | 0 refills | Status: AC
  Filled 2021-05-07: qty 30, 10d supply, fill #0

## 2021-05-07 MED ORDER — OXYCODONE HCL 5 MG PO TABS
5.0000 mg | ORAL_TABLET | ORAL | 0 refills | Status: DC | PRN
  Filled 2021-05-07: qty 20, 4d supply, fill #0

## 2021-05-07 NOTE — Progress Notes (Signed)
Chief Complaint: left knee pain     History of Present Illness:    Blake Gomez is a 60 y.o. male presents with ongoing left knee pain for 6 to 8 months.  He currently works in plumbing as well as flooring and has significant pain about the left knee particularly with significant activity like working in crawl spaces and under buildings.  He has had 2 previous injections the first 1 which helped for several months and the second 1 which did not help.  He has been taking Advil which does not significantly help with the pain.  He states that he has pain with direct activity.  He occasionally feels some crepitus or popping.  He did not have any specific injury or trauma.  He is currently on aspirin as well as Plavix for cardiac history.    Surgical History:   None  PMH/PSH/Family History/Social History/Meds/Allergies:    Past Medical History:  Diagnosis Date   Bronchitis, mucopurulent recurrent ([facility])    CAD (coronary artery disease)    stent 2014.  No MI.  Echo normal.   Chronic renal insufficiency, stage 2 (mild)    GERD (gastroesophageal reflux disease)    Hay fever    Hematochezia 2017; 2019   Hematochezia/screening: 2017 Colonoscopy normal except adenomatous polyp.  2019 colonoscopy, +polyp, recall 2022.   History of adenomatous polyp of colon 2017; 2019   Recall 2022   History of hematuria    per old records   Hypercholesterolemia    Hypertension    Left knee pain 2021   MRI 10/2019-> moderate DJD medial mostly, +deg tear of medial and lateral menisci, +effusion, +popliteal cyst->recommended ortho   Obesity, Class II, BMI 35-39.9    Unilateral primary osteoarthritis, left knee    + deg menisc tears: Dr. August Saucer, steroid inj 11/2019, pt wants to avoid surgery   Past Surgical History:  Procedure Laterality Date   CARDIAC CATHETERIZATION  09/2008   95% LAD lesion-->DES   CARDIOVASCULAR STRESS TEST  2015   cardiolyte->no ischemia.  EF 60%.    COLONOSCOPY  2017/2019   Hematochezia/screening: 2017 Colonoscopy normal except adenomatous polyp.  2019 colonoscopy, +polyp, recall 2022.   CORONARY ANGIOPLASTY WITH STENT PLACEMENT  2010   DES to LAD   Social History   Socioeconomic History   Marital status: Married    Spouse name: Not on file   Number of children: Not on file   Years of education: Not on file   Highest education level: Not on file  Occupational History   Not on file  Tobacco Use   Smoking status: Never   Smokeless tobacco: Never  Vaping Use   Vaping Use: Never used  Substance and Sexual Activity   Alcohol use: Never   Drug use: Never   Sexual activity: Not on file  Other Topics Concern   Not on file  Social History Narrative   Married, wife [name].  No children.   Relocated from [facility] b/c lost job due to covid.   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   [address] on [address]   No T/A/Ds.   Social Determinants of Health   Financial Resource Strain: Not on file  Food Insecurity: Not on file  Transportation Needs: Not on file  Physical Activity: Not  on file  Stress: Not on file  Social Connections: Not on file   Family History  Problem Relation Age of Onset   Heart disease Mother    Diverticulitis Mother    Cancer Father    Early death Brother        car accident   Alzheimer's disease Maternal Grandmother    No Known Allergies Current Outpatient Medications  Medication Sig Dispense Refill   aspirin 81 MG EC tablet Take 1 tablet (81 mg total) by mouth daily. Swallow whole. 90 tablet 3   atorvastatin (LIPITOR) 80 MG tablet Take once a day for cholesterol 90 tablet 3   benzonatate (TESSALON) 100 MG capsule Take 1 capsule (100 mg total) by mouth 3 (three) times daily as needed for cough. 40 capsule 3   clopidogrel (PLAVIX) 75 MG tablet Take one tablet a day for artery disease 90 tablet 3   clotrimazole-betamethasone (LOTRISONE) cream Apply 1 application topically daily.  45 g 3   diclofenac Sodium (VOLTAREN) 1 % GEL Apply 4 g topically 4 (four) times daily. 100 g 2   Fish Oil-Krill Oil (MEGARED ADVANCED 4 IN 1 PO) Take by mouth daily.     folic acid (FOLVITE) Q000111Q MCG tablet folic acid Q000111Q mcg tablet  Take 1 tablet every day by oral route.     ketoconazole (NIZORAL) 2 % shampoo Apply 1 application topically 2 (two) times a week. 120 mL 0   metoprolol succinate (TOPROL-XL) 25 MG 24 hr tablet Take one tablet once a day for blood pressure and heart rate 90 tablet 3   Multiple Vitamins-Minerals (CENTRUM SILVER PO) Centrum Silver Men 300 mcg-600 mcg-300 mcg tablet  Take 1 tablet every day by oral route.     nitroGLYCERIN (NITROSTAT) 0.4 MG SL tablet Place 1 tablet (0.4 mg total) under the tongue every 5 (five) minutes as needed for chest pain. 25 tablet 3   predniSONE (DELTASONE) 20 MG tablet Take 2 tablets (40 mg total) by mouth daily with breakfast. For 5 days- for bronchitis 10 tablet 0   No current facility-administered medications for this visit.   No results found.  Review of Systems:   A ROS was performed including pertinent positives and negatives as documented in the HPI.  Physical Exam :   Constitutional: NAD and appears stated age Neurological: Alert and oriented Psych: Appropriate affect and cooperative There were no vitals taken for this visit.   Comprehensive Musculoskeletal Exam:     Musculoskeletal Exam  Gait Normal  Alignment Normal   Right Left  Inspection Normal Normal  Palpation    Tenderness none Medial joint  Crepitus None None  Effusion none None  Range of Motion    Extension 0 0  Flexion 135 135 with pain  Strength    Extension 5/5 5/5  Flexion 5/5 5/5  Ligament Exam     Generalized Laxity No No  Lachman Negative Negative   Pivot Shift Negative Negative  Anterior Drawer Negative Negative  Valgus at 0 Negative Negative  Valgus at 20 Negative Negative  Varus at 0 0 0  Varus at 20   0 0  Posterior Drawer at 90 0 0   Vascular/Lymphatic Exam    Edema None None  Venous Stasis Changes No No  Distal Circulation Normal Normal  Neurologic    Light Touch Sensation Intact Intact  Special Tests: Positive McMurray left knee medial joint     Imaging:   Xray (4 views left knee): Essentially normal with  mild medial compartment narrowing  MRI (left knee): There is a significant posterior medial meniscal tear with extension to the posterior horn.  The tear appears to be flipped into the notch.  I personally reviewed and interpreted the radiographs.   Assessment:   60 year old male with a left medial meniscal tear complex which appears to be flipped into the notch.  Overall I have advised that these typically can cause significant pain with activity as they often displaced into the notch and cause irritation.  He has no longer getting any type of pain relief from injections.  As result I would like to plan to perform a left knee arthroscopy with medial meniscal debridement.  Plan :    -Plan for left knee arthroscopy with medial meniscal debridement   After a lengthy discussion of treatment options, including risks, benefits, alternatives, complications of surgical and nonsurgical conservative options, the patient elected surgical repair.   The patient  is aware of the material risks  and complications including, but not limited to injury to adjacent structures, neurovascular injury, infection, numbness, bleeding, implant failure, thermal burns, stiffness, persistent pain, failure to heal, disease transmission from allograft, need for further surgery, dislocation, anesthetic risks, blood clots, risks of death,and others. The probabilities of surgical success and failure discussed with patient given their particular co-morbidities.The time and nature of expected rehabilitation and recovery was discussed.The patient's questions were all answered preoperatively.  No barriers to understanding were noted. I explained  the natural history of the disease process and Rx rationale.  I explained to the patient what I considered to be reasonable expectations given their personal situation.  The final treatment plan was arrived at through a shared patient decision making process model.     I personally saw and evaluated the patient, and participated in the management and treatment plan.  Vanetta Mulders, MD Attending Physician, Orthopedic Surgery  This document was dictated using Dragon voice recognition software. A reasonable attempt at proof reading has been made to minimize errors.

## 2021-05-07 NOTE — Patient Instructions (Signed)
Disability and Out-of-Work Paperwork  If you would like to file disability (short term and/or and long term), FMLA, or other out-of-work paperwork, please mail, hand deliver, or fax the paperwork to our main office:   [facility]  [address], [place] [address] Phone: [phone] Fax: [phone]  We are unable to complete these forms in our [facility] satellite office and want to ensure your paperwork is filed in an appropriate and proper manner.   Thank you for understanding.   Pre-Operative Appointment  Post (after) Operative Medications:   You were likely prescribed 4 medications:  A. Acetaminophen (Tylenol) 500 MG B. Ibuprofen (Advil) 800 MG C. Oxycodone 5 MG D. Aspirin 325 MG  These medications do not need to be taken until AFTER  surgery. All medications should be taken according to prescription, pharmacy and prescriber direction.   These medications can be picked up from the [facility] on the first floor of [facility] immediately after your appointment.   Aspirin should be taken once a day for 30 days following surgery to prevent complications, such as blood clots.   Oxycodone is an opioid and should be used for breakthrough pain.   Acetaminophen and Ibuprofen should be used intermittently for pain control.    2. Surgery Scheduling  Our surgery scheduler, Chrystine Oiler, will be in contact with you to schedule your surgery. She will give you details on the day, time and the location your surgery will be performed. Chrystine Oiler will also schedule your first post-operative appointment 2 weeks after your scheduled surgery date. This appointment will be in our office at [facility], not the location where surgery is performed.   Chrystine Oiler  [phone] Phone [phone] Fax [email or web address]  Closer to the date of your surgery, a nurse  from the hospital or surgery center will contact you about specific details prior to surgery such as when to cease eating, when to arrive at the facility, and what you can expect the day of surgery.

## 2021-05-11 ENCOUNTER — Telehealth (HOSPITAL_COMMUNITY): Payer: Self-pay | Admitting: Vascular Surgery

## 2021-05-11 NOTE — Progress Notes (Signed)
Anesthesia APP Notation: Call by surgery scheduler for Dr. Huel Cote. He is looking to scheduled patient for left knee arthroscopy with medial meniscal debridement for a left medial meniscal tear. Surgery has not yet been scheduled, but patient hoping to get scheduled before the end of the year. Patient is on ASA and Plavix for CAD. Last saw Dr. Dulce Sellar in 09/2019 and has upcoming visit with Dr. Cristal Deer next month for HLD. PCP is Early, Sung Amabile, NP with recent visit. She refilled Plavix. Dr. Steward Drone is requesting medical and/or cardiology clearance (request for Plavix hold), so scheduler is working on this.  Plan further anesthesia review once case is scheduled.   Shonna Chock, PA-C Surgical Short Stay/Anesthesiology [facility] Phone [phone] [facility] Phone [phone] 05/11/2021 3:21 PM

## 2021-05-15 ENCOUNTER — Encounter (HOSPITAL_BASED_OUTPATIENT_CLINIC_OR_DEPARTMENT_OTHER): Payer: Self-pay

## 2021-05-21 NOTE — Progress Notes (Signed)
Cardiology Office Note:    Date:  05/22/2021   ID:  Blake Gomez, DOB 03-13-1961, MRN [id number]  PCP:  Orma Render, NP   [facility] Providers Cardiologist:  Skeet Latch, MD     Referring MD: Orma Render, NP   Chief Complaint: decreased pulses in my feet  History of Present Illness:    Blake Gomez is a 60 y.o. male with a hx of CAD with stent placement x 2 in 2014 at [facility] in [place], [place] on DAPT, mixed hyperlipidemia, GERD, and obesity. He was last seen in our office on 10/07/19 by Dr. Bettina Gavia and 1 year follow-up was recommended.   Today, he is here alone. He reports he was referred by PCP for evaluation of decreased pulses and numbness in both lower extremities. Our office was sent a message requesting the patient be scheduled for pre-op clearance for knee surgery by Dr. Eddie Dibbles office. Patient states he wants to have the blood flow in his legs checked prior to scheduling surgery. He is also having dyspnea on exertion, bilateral lower extremity swelling, and PND. He denies orhtopnea. He denies chest pain, fatigue, palpitations, melena, hematuria, hemoptysis, diaphoresis, weakness, presyncope, and syncope. He is having pain in both lower extremities at the end of the day that improves with rest. 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 He would like to have future cardiology appointments at this location.    Past Medical History:  Diagnosis Date   Bronchitis, mucopurulent recurrent ([place])    CAD (coronary artery disease)    stent 2014.  No MI.  Echo normal.   Chronic renal insufficiency, stage 2 (mild)    GERD (gastroesophageal reflux disease)    Hay fever    Hematochezia 2017; 2019   Hematochezia/screening: 2017 Colonoscopy normal except adenomatous polyp.  2019 colonoscopy, +polyp, recall 2022.   History of adenomatous polyp of colon 2017;  2019   Recall 2022   History of hematuria    per old records   Hypercholesterolemia    Hypertension    Left knee pain 2021   MRI 10/2019-> moderate DJD medial mostly, +deg tear of medial and lateral menisci, +effusion, +popliteal cyst->recommended ortho   Obesity, Class II, BMI 35-39.9    Unilateral primary osteoarthritis, left knee    + deg menisc tears: Dr. Marlou Sa, steroid inj 11/2019, pt wants to avoid surgery    Past Surgical History:  Procedure Laterality Date   CARDIAC CATHETERIZATION  09/2008   95% LAD lesion-->DES   CARDIOVASCULAR STRESS TEST  2015   cardiolyte->no ischemia.  EF 60%.   COLONOSCOPY  2017/2019   Hematochezia/screening: 2017 Colonoscopy normal except adenomatous polyp.  2019 colonoscopy, +polyp, recall 2022.   CORONARY ANGIOPLASTY WITH STENT PLACEMENT  2010   DES to LAD    Current Medications: Current Meds  Medication Sig   aspirin 81 MG EC tablet Take 1 tablet (81 mg total) by mouth daily. Swallow whole.   atorvastatin (LIPITOR) 80 MG tablet Take once a day for cholesterol   benzonatate (TESSALON) 100 MG capsule Take 1 capsule (100 mg total) by mouth 3 (three) times daily as needed for cough.   clopidogrel (PLAVIX) 75 MG tablet Take one tablet a day for artery disease   clotrimazole-betamethasone (LOTRISONE) cream Apply 1 application topically daily.   diclofenac Sodium (VOLTAREN) 1 % GEL Apply  4 g topically 4 (four) times daily.   Fish Oil-Krill Oil (MEGARED ADVANCED 4 IN 1 PO) Take by mouth daily.   folic acid (FOLVITE) Q000111Q MCG tablet folic acid Q000111Q mcg tablet  Take 1 tablet every day by oral route.   ketoconazole (NIZORAL) 2 % shampoo Apply 1 application topically 2 (two) times a week.   Multiple Vitamins-Minerals (CENTRUM SILVER PO) Centrum Silver Men 300 mcg-600 mcg-300 mcg tablet  Take 1 tablet every day by oral route.   nitroGLYCERIN (NITROSTAT) 0.4 MG SL tablet Place 1 tablet (0.4 mg total) under the tongue every 5 (five) minutes as needed for chest  pain.   oxyCODONE (OXY IR/ROXICODONE) 5 MG immediate release tablet Take 1 tablet (5 mg total) by mouth every 4 (four) hours as needed (severe pain).   predniSONE (DELTASONE) 20 MG tablet Take 2 tablets (40 mg total) by mouth daily with breakfast. For 5 days- for bronchitis   [DISCONTINUED] metoprolol succinate (TOPROL-XL) 25 MG 24 hr tablet Take one tablet once a day for blood pressure and heart rate     Allergies:   Patient has no known allergies.   Social History   Socioeconomic History   Marital status: Married    Spouse name: Not on file   Number of children: Not on file   Years of education: Not on file   Highest education level: Not on file  Occupational History   Not on file  Tobacco Use   Smoking status: Never   Smokeless tobacco: Never  Vaping Use   Vaping Use: Never used  Substance and Sexual Activity   Alcohol use: Never   Drug use: Never   Sexual activity: Not on file  Other Topics Concern   Not on file  Social History Narrative   Married, wife [name].  No children.   Relocated from [facility] b/c lost job due to covid.   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   Lives on [place].   No T/A/Ds.   Social Determinants of Health   Financial Resource Strain: Not on file  Food Insecurity: Not on file  Transportation Needs: Not on file  Physical Activity: Not on file  Stress: Not on file  Social Connections: Not on file     Family History: The patient's family history includes Alzheimer's disease in his maternal grandmother; Cancer in his father; Diverticulitis in his mother; Early death in his brother; Heart disease in his mother.  ROS:   Please see the history of present illness.    ++bilateral lower extremity edema All other systems reviewed and are negative.  Labs/Other Studies Reviewed:    The following studies were reviewed today:  None available  Recent Labs: 05/03/2021: ALT 23; BUN 17; Creatinine, Ser 1.01; Hemoglobin  15.4; Platelets 289; Potassium 4.7; Sodium 139  Recent Lipid Panel    Component Value Date/Time   CHOL 205 (H) 05/03/2021 1025   TRIG 176 (H) 05/03/2021 1025   HDL 34 (L) 05/03/2021 1025   CHOLHDL 6.0 (H) 05/03/2021 1025   LDLCALC 139 (H) 05/03/2021 1025      Physical Exam:    VS:  BP 118/76   Pulse (!) 51   Ht 5\' 10"  (1.778 m)   Wt 280 lb (127 kg)   BMI 40.18 kg/m     Wt Readings from Last 3 Encounters:  05/22/21 280 lb (127 kg)  05/03/21 278 lb (126.1 kg)  02/04/20 273 lb 9.6 oz (124.1 kg)  GEN:  Well nourished, well developed in no acute distress HEENT: Normal NECK: No JVD; No carotid bruits LYMPHATICS: No lymphadenopathy CARDIAC: RRR, no murmurs, rubs, gallops RESPIRATORY:  Clear to auscultation without rales, wheezing or rhonchi  ABDOMEN: Soft, non-tender, non-distended MUSCULOSKELETAL:  Bilateral non-pitting lower extremity edema; No deformity  SKIN: Warm and dry NEUROLOGIC:  Alert and oriented x 3 PSYCHIATRIC:  Normal affect   EKG:  EKG is ordered today.  The ekg ordered today demonstrates sinus bradycardia at rate of 51 bpm.   Diagnoses:    1. Essential hypertension   2. Mixed hyperlipidemia   3. Claudication of both lower extremities ([place])   4. Dyspnea on exertion   5. Sinus bradycardia   6. Numbness of lower extremity   7. Coronary artery disease involving native coronary artery of native heart without angina pectoris    Assessment and Plan:     Preoperative Clearance: Patient has been evaluated for bilateral lower extremity numbness and a meniscal tear of the left knee was identified by ortho. Cardiac clearance was requested since patient has not been seen since April 2021. According to the Revised Cardiac Risk Index (RCRI), his Perioperative Risk of Major Cardiac Event is (%): 6.6. His Functional Capacity in METs is: 5.07 according to the Duke Activity Status Index (DASI). Will plan to get echocardiogram prior to surgery. If echo does not show  significant concern, then patient can plan to proceed with low-risk surgery. He may hold Plavix for 5 days in preparation for proceeding with knee surgery. Would prefer that he remain on aspirin during the perioperative time if deemed appropriate by the surgeon.   CAD native without angina: Pain free today. No concerns for worsening angina. Has coronary stenting x 2 done in 2014 in [place]. We do not have any records. On DAPT without concerns for bleeding.   Bilateral lower extremity numbness/Claudication: He states his PCP was not able to feel a pulse in his left foot so she referred him to our office. Patient has bilateral 2+ pedal pulses, warm to touch, no cyanosis noted upon my assessment today. He requests further testing to check the blood flow. I feel this is appropriate as patient also c/o claudication. Will get lower extremity arterial vascular studies.   Essential hypertension: BP is well-controlled today. He reports he does not have problems with elevated BP. He is only on low dose beta blocker which I am further reducing today due to bradycardia. Encouraged weight loss. Will continue to monitor.   Dyspnea on exertion: He is having increasing dyspnea with activity and PND. Denies orthopnea. Does not monitor daily weight. Has recently noticed increased bilateral lower extremity swelling. We do not have an echocardiogram on file for comparison. Will get echo and await results prior to proceeding with surgical clearance.   Sinus bradycardia: HR is 51 today. Denies dizziness, lightheadedness, or syncope. He is not aware of recent pulse readings but upon record review, HR is more consistently > 60 bpm at recent office visits. This may be contributing to his fatigue. Will reduce Toprol XL to 12.5 mg daily.   Hyperlipidemia LDL goal < 70: Had recent lipid panel with PCP on 05/03/21, LDL 139. States he is compliant on atorvastatin 80 mg daily. Was advised to work on better diet and exercise and  will recheck in 3 months. Would favor the addition of ezetimibe or [id number]-I given his history of CAD. Continue to follow.   Disposition: Echo, LE vascular duplex, f/u with APP at  [facility] in 6-8 weeks.      Medication Adjustments/Labs and Tests Ordered: Current medicines are reviewed at length with the patient today.  Concerns regarding medicines are outlined above.  Orders Placed This Encounter  Procedures   EKG 12-Lead   ECHOCARDIOGRAM COMPLETE   VAS [place] LOWER EXTREMITY ARTERIAL DUPLEX   VAS [place] LOWER EXT ART SEG MULTI (SEGMENTALS & LE RAYNAUDS)    Meds ordered this encounter  Medications   metoprolol succinate (TOPROL-XL) 25 MG 24 hr tablet    Sig: Take 0.5 tablets (12.5 mg total) by mouth daily.    Dispense:  45 tablet    Refill:  3     Patient Instructions  Medication Instructions:  Your physician has recommended you make the following change in your medication:   Decrease your metoprolol Succinate to 12.5 mg (half tablet) daily   *If you need a refill on your cardiac medications before your next appointment, please call your pharmacy*   Lab Work: None ordered today   Testing/Procedures: Your physician has requested that you have an echocardiogram. Echocardiography is a painless test that uses sound waves to create images of your heart. It provides your doctor with information about the size and shape of your heart and how well your heart's chambers and valves are working. This procedure takes approximately one hour. There are no restrictions for this procedure. [place] has requested that you have an ankle brachial index (ABI). During this test an ultrasound and blood pressure cuff are used to evaluate the arteries that supply the arms and legs with blood. Allow thirty minutes for this exam. There are no restrictions or special instructions.  Your physician has requested that you have a lower extremity arterial duplex. This test is an  ultrasound of the arteries in the legs. It looks at arterial blood flow in the legs and arms. Allow one hour for Lower and Upper Arterial scans. There are no restrictions or special instructions    Follow-Up: At [facility], you and your health needs are our priority.  As part of our continuing mission to provide you with exceptional heart care, we have created designated Provider Care Teams.  These Care Teams include your primary Cardiologist (physician) and Advanced Practice Providers (APPs -  Physician Assistants and Nurse Practitioners) who all work together to provide you with the care you need, when you need it.  We recommend signing up for the patient portal called "MyChart".  Sign up information is provided on this After Visit Summary.  MyChart is used to connect with patients for Virtual Visits (Telemedicine).  Patients are able to view lab/test results, encounter notes, upcoming appointments, etc.  Non-urgent messages can be sent to your provider as well.   To learn more about what you can do with MyChart, go to [email or web address].    Your next appointment:   6 week(s)  The format for your next appointment:   In Person  Provider:   Skeet Latch, MD, Laurann [place], NP, or Coletta Memos, NP    Other Instructions None today    Signed, Emmaline Life, NP  05/22/2021 3:48 PM    [place]

## 2021-05-21 NOTE — H&P (View-Only) (Signed)
Cardiology Office Note:    Date:  05/22/2021   ID:  Blake Gomez, DOB [birth year], MRN [id number]  PCP:  Tollie Eth, NP   [facility] Providers Cardiologist:  Chilton Si, MD     Referring MD: Tollie Eth, NP   Chief Complaint: decreased pulses in my feet  History of Present Illness:    Bay Wayson is a 60 y.o. male with a hx of CAD with stent placement x 2 in 2014 at [facility] in [place], [place] on DAPT, mixed hyperlipidemia, GERD, and obesity. He was last seen in our office on 10/07/19 by Dr. Dulce Sellar and 1 year follow-up was recommended.   Today, he is here alone. He reports he was referred by PCP for evaluation of decreased pulses and numbness in both lower extremities. Our office was sent a message requesting the patient be scheduled for pre-op clearance for knee surgery by Dr. Serena Croissant office. Patient states he wants to have the blood flow in his legs checked prior to scheduling surgery. He is also having dyspnea on exertion, bilateral lower extremity swelling, and PND. He denies orhtopnea. He denies chest pain, fatigue, palpitations, melena, hematuria, hemoptysis, diaphoresis, weakness, presyncope, and syncope. He is having pain in both lower extremities at the end of the day that improves with rest. 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 He would like to have future cardiology appointments at this location.    Past Medical History:  Diagnosis Date   Bronchitis, mucopurulent recurrent (HCC)    CAD (coronary artery disease)    stent 2014.  No MI.  Echo normal.   Chronic renal insufficiency, stage 2 (mild)    GERD (gastroesophageal reflux disease)    Hay fever    Hematochezia 2017; 2019   Hematochezia/screening: 2017 Colonoscopy normal except adenomatous polyp.  2019 colonoscopy, +polyp, recall 2022.   History of adenomatous polyp of colon 2017;  2019   Recall 2022   History of hematuria    per old records   Hypercholesterolemia    Hypertension    Left knee pain 2021   MRI 10/2019-> moderate DJD medial mostly, +deg tear of medial and lateral menisci, +effusion, +popliteal cyst->recommended ortho   Obesity, Class II, BMI 35-39.9    Unilateral primary osteoarthritis, left knee    + deg menisc tears: Dr. August Saucer, steroid inj 11/2019, pt wants to avoid surgery    Past Surgical History:  Procedure Laterality Date   CARDIAC CATHETERIZATION  09/2008   95% LAD lesion-->DES   CARDIOVASCULAR STRESS TEST  2015   cardiolyte->no ischemia.  EF 60%.   COLONOSCOPY  2017/2019   Hematochezia/screening: 2017 Colonoscopy normal except adenomatous polyp.  2019 colonoscopy, +polyp, recall 2022.   CORONARY ANGIOPLASTY WITH STENT PLACEMENT  2010   DES to LAD    Current Medications: Current Meds  Medication Sig   aspirin 81 MG EC tablet Take 1 tablet (81 mg total) by mouth daily. Swallow whole.   atorvastatin (LIPITOR) 80 MG tablet Take once a day for cholesterol   benzonatate (TESSALON) 100 MG capsule Take 1 capsule (100 mg total) by mouth 3 (three) times daily as needed for cough.   clopidogrel (PLAVIX) 75 MG tablet Take one tablet a day for artery disease   clotrimazole-betamethasone (LOTRISONE) cream Apply 1 application topically daily.   diclofenac Sodium (VOLTAREN) 1 % GEL Apply  4 g topically 4 (four) times daily.   Fish Oil-Krill Oil (MEGARED ADVANCED 4 IN 1 PO) Take by mouth daily.   folic acid (FOLVITE) Q000111Q MCG tablet folic acid Q000111Q mcg tablet  Take 1 tablet every day by oral route.   ketoconazole (NIZORAL) 2 % shampoo Apply 1 application topically 2 (two) times a week.   Multiple Vitamins-Minerals (CENTRUM SILVER PO) Centrum Silver Men 300 mcg-600 mcg-300 mcg tablet  Take 1 tablet every day by oral route.   nitroGLYCERIN (NITROSTAT) 0.4 MG SL tablet Place 1 tablet (0.4 mg total) under the tongue every 5 (five) minutes as needed for chest  pain.   oxyCODONE (OXY IR/ROXICODONE) 5 MG immediate release tablet Take 1 tablet (5 mg total) by mouth every 4 (four) hours as needed (severe pain).   predniSONE (DELTASONE) 20 MG tablet Take 2 tablets (40 mg total) by mouth daily with breakfast. For 5 days- for bronchitis   [DISCONTINUED] metoprolol succinate (TOPROL-XL) 25 MG 24 hr tablet Take one tablet once a day for blood pressure and heart rate     Allergies:   Patient has no known allergies.   Social History   Socioeconomic History   Marital status: Married    Spouse name: Not on file   Number of children: Not on file   Years of education: Not on file   Highest education level: Not on file  Occupational History   Not on file  Tobacco Use   Smoking status: Never   Smokeless tobacco: Never  Vaping Use   Vaping Use: Never used  Substance and Sexual Activity   Alcohol use: Never   Drug use: Never   Sexual activity: Not on file  Other Topics Concern   Not on file  Social History Narrative   Married, wife [name].  No children.   Relocated from [facility] b/c lost job due to covid.   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   Lives on [address] [place].   No T/A/Ds.   Social Determinants of Health   Financial Resource Strain: Not on file  Food Insecurity: Not on file  Transportation Needs: Not on file  Physical Activity: Not on file  Stress: Not on file  Social Connections: Not on file     Family History: The patient's family history includes Alzheimer's disease in his maternal grandmother; Cancer in his father; Diverticulitis in his mother; Early death in his brother; Heart disease in his mother.  ROS:   Please see the history of present illness.    ++bilateral lower extremity edema All other systems reviewed and are negative.  Labs/Other Studies Reviewed:    The following studies were reviewed today:  None available  Recent Labs: 05/03/2021: ALT 23; BUN 17; Creatinine, Ser 1.01; Hemoglobin  15.4; Platelets 289; Potassium 4.7; Sodium 139  Recent Lipid Panel    Component Value Date/Time   CHOL 205 (H) 05/03/2021 1025   TRIG 176 (H) 05/03/2021 1025   HDL 34 (L) 05/03/2021 1025   CHOLHDL 6.0 (H) 05/03/2021 1025   LDLCALC 139 (H) 05/03/2021 1025      Physical Exam:    VS:  BP 118/76    Pulse (!) 51    Ht 5\' 10"  (1.778 m)    Wt 280 lb (127 kg)    BMI 40.18 kg/m     Wt Readings from Last 3 Encounters:  05/22/21 280 lb (127 kg)  05/03/21 278 lb (126.1 kg)  02/04/20 273 lb 9.6  oz (124.1 kg)     GEN:  Well nourished, well developed in no acute distress HEENT: Normal NECK: No JVD; No carotid bruits LYMPHATICS: No lymphadenopathy CARDIAC: RRR, no murmurs, rubs, gallops RESPIRATORY:  Clear to auscultation without rales, wheezing or rhonchi  ABDOMEN: Soft, non-tender, non-distended MUSCULOSKELETAL:  Bilateral non-pitting lower extremity edema; No deformity  SKIN: Warm and dry NEUROLOGIC:  Alert and oriented x 3 PSYCHIATRIC:  Normal affect   EKG:  EKG is ordered today.  The ekg ordered today demonstrates sinus bradycardia at rate of 51 bpm.   Diagnoses:    1. Essential hypertension   2. Mixed hyperlipidemia   3. Claudication of both lower extremities ([place])   4. Dyspnea on exertion   5. Sinus bradycardia   6. Numbness of lower extremity   7. Coronary artery disease involving native coronary artery of native heart without angina pectoris    Assessment and Plan:     Preoperative Clearance: Patient has been evaluated for bilateral lower extremity numbness and a meniscal tear of the left knee was identified by ortho. Cardiac clearance was requested since patient has not been seen since April 2021. According to the Revised Cardiac Risk Index (RCRI), his Perioperative Risk of Major Cardiac Event is (%): 6.6. His Functional Capacity in METs is: 5.07 according to the Duke Activity Status Index (DASI). Will plan to get echocardiogram prior to surgery. If echo does not show  significant concern, then patient can plan to proceed with low-risk surgery. He may hold Plavix for 5 days in preparation for proceeding with knee surgery. Would prefer that he remain on aspirin during the perioperative time if deemed appropriate by the surgeon.   CAD native without angina: Pain free today. No concerns for worsening angina. Has coronary stenting x 2 done in 2014 in [place]. We do not have any records. On DAPT without concerns for bleeding.   Bilateral lower extremity numbness/Claudication: He states his PCP was not able to feel a pulse in his left foot so she referred him to our office. Patient has bilateral 2+ pedal pulses, warm to touch, no cyanosis noted upon my assessment today. He requests further testing to check the blood flow. I feel this is appropriate as patient also c/o claudication. Will get lower extremity arterial vascular studies.   Essential hypertension: BP is well-controlled today. He reports he does not have problems with elevated BP. He is only on low dose beta blocker which I am further reducing today due to bradycardia. Encouraged weight loss. Will continue to monitor.   Dyspnea on exertion: He is having increasing dyspnea with activity and PND. Denies orthopnea. Does not monitor daily weight. Has recently noticed increased bilateral lower extremity swelling. We do not have an echocardiogram on file for comparison. Will get echo and await results prior to proceeding with surgical clearance.   Sinus bradycardia: HR is 51 today. Denies dizziness, lightheadedness, or syncope. He is not aware of recent pulse readings but upon record review, HR is more consistently > 60 bpm at recent office visits. This may be contributing to his fatigue. Will reduce Toprol XL to 12.5 mg daily.   Hyperlipidemia LDL goal < 70: Had recent lipid panel with PCP on 05/03/21, LDL 139. States he is compliant on atorvastatin 80 mg daily. Was advised to work on better diet and exercise and  will recheck in 3 months. Would favor the addition of ezetimibe or [id number]-I given his history of CAD. Continue to follow.   Disposition: Echo,  LE vascular duplex, f/u with APP at [facility] in 6-8 weeks.      Medication Adjustments/Labs and Tests Ordered: Current medicines are reviewed at length with the patient today.  Concerns regarding medicines are outlined above.  Orders Placed This Encounter  Procedures   EKG 12-Lead   ECHOCARDIOGRAM COMPLETE   VAS [place] LOWER EXTREMITY ARTERIAL DUPLEX   VAS [place] LOWER EXT ART SEG MULTI (SEGMENTALS & LE RAYNAUDS)    Meds ordered this encounter  Medications   metoprolol succinate (TOPROL-XL) 25 MG 24 hr tablet    Sig: Take 0.5 tablets (12.5 mg total) by mouth daily.    Dispense:  45 tablet    Refill:  3     Patient Instructions  Medication Instructions:  Your physician has recommended you make the following change in your medication:   Decrease your metoprolol Succinate to 12.5 mg (half tablet) daily   *If you need a refill on your cardiac medications before your next appointment, please call your pharmacy*   Lab Work: None ordered today   Testing/Procedures: Your physician has requested that you have an echocardiogram. Echocardiography is a painless test that uses sound waves to create images of your heart. It provides your doctor with information about the size and shape of your heart and how well your hearts chambers and valves are working. This procedure takes approximately one hour. There are no restrictions for this procedure. [facility] has requested that you have an ankle brachial index (ABI). During this test an ultrasound and blood pressure cuff are used to evaluate the arteries that supply the arms and legs with blood. Allow thirty minutes for this exam. There are no restrictions or special instructions.  Your physician has requested that you have a lower extremity arterial duplex. This test is an  ultrasound of the arteries in the legs. It looks at arterial blood flow in the legs and arms. Allow one hour for Lower and Upper Arterial scans. There are no restrictions or special instructions    Follow-Up: At [facility], you and your health needs are our priority.  As part of our continuing mission to provide you with exceptional heart care, we have created designated Provider Care Teams.  These Care Teams include your primary Cardiologist (physician) and Advanced Practice Providers (APPs -  Physician Assistants and Nurse Practitioners) who all work together to provide you with the care you need, when you need it.  We recommend signing up for the patient portal called "MyChart".  Sign up information is provided on this After Visit Summary.  MyChart is used to connect with patients for Virtual Visits (Telemedicine).  Patients are able to view lab/test results, encounter notes, upcoming appointments, etc.  Non-urgent messages can be sent to your provider as well.   To learn more about what you can do with MyChart, go to [email or web address].    Your next appointment:   6 week(s)  The format for your next appointment:   In Person  Provider:   Skeet Latch, MD, Laurann Montana, NP, or Coletta Memos, NP    Other Instructions None today    Signed, Emmaline Life, NP  05/22/2021 3:48 PM    [place]

## 2021-05-22 ENCOUNTER — Other Ambulatory Visit: Payer: Self-pay

## 2021-05-22 ENCOUNTER — Encounter (HOSPITAL_BASED_OUTPATIENT_CLINIC_OR_DEPARTMENT_OTHER): Payer: Self-pay | Admitting: Nurse Practitioner

## 2021-05-22 ENCOUNTER — Ambulatory Visit (HOSPITAL_BASED_OUTPATIENT_CLINIC_OR_DEPARTMENT_OTHER): Payer: BC Managed Care – PPO | Admitting: Nurse Practitioner

## 2021-05-22 VITALS — BP 118/76 | HR 51 | Ht 70.0 in | Wt 280.0 lb

## 2021-05-22 DIAGNOSIS — I10 Essential (primary) hypertension: Secondary | ICD-10-CM

## 2021-05-22 DIAGNOSIS — R0609 Other forms of dyspnea: Secondary | ICD-10-CM

## 2021-05-22 DIAGNOSIS — I739 Peripheral vascular disease, unspecified: Secondary | ICD-10-CM

## 2021-05-22 DIAGNOSIS — E782 Mixed hyperlipidemia: Secondary | ICD-10-CM

## 2021-05-22 DIAGNOSIS — R001 Bradycardia, unspecified: Secondary | ICD-10-CM

## 2021-05-22 DIAGNOSIS — R200 Anesthesia of skin: Secondary | ICD-10-CM

## 2021-05-22 DIAGNOSIS — I2510 Atherosclerotic heart disease of native coronary artery without angina pectoris: Secondary | ICD-10-CM

## 2021-05-22 MED ORDER — METOPROLOL SUCCINATE ER 25 MG PO TB24: 12.5000 mg | ORAL_TABLET | Freq: Every day | ORAL | 3 refills | Status: DC

## 2021-05-22 NOTE — Patient Instructions (Signed)
Medication Instructions:  Your physician has recommended you make the following change in your medication:   Decrease your metoprolol Succinate to 12.5 mg (half tablet) daily   *If you need a refill on your cardiac medications before your next appointment, please call your pharmacy*   Lab Work: None ordered today   Testing/Procedures: Your physician has requested that you have an echocardiogram. Echocardiography is a painless test that uses sound waves to create images of your heart. It provides your doctor with information about the size and shape of your heart and how well your heart's chambers and valves are working. This procedure takes approximately one hour. There are no restrictions for this procedure. [id number]  Your physician has requested that you have an ankle brachial index (ABI). During this test an ultrasound and blood pressure cuff are used to evaluate the arteries that supply the arms and legs with blood. Allow thirty minutes for this exam. There are no restrictions or special instructions.  Your physician has requested that you have a lower extremity arterial duplex. This test is an ultrasound of the arteries in the legs. It looks at arterial blood flow in the legs and arms. Allow one hour for Lower and Upper Arterial scans. There are no restrictions or special instructions    Follow-Up: At [facility], you and your health needs are our priority.  As part of our continuing mission to provide you with exceptional heart care, we have created designated Provider Care Teams.  These Care Teams include your primary Cardiologist (physician) and Advanced Practice Providers (APPs -  Physician Assistants and Nurse Practitioners) who all work together to provide you with the care you need, when you need it.  We recommend signing up for the patient portal called "MyChart".  Sign up information is provided on this After Visit Summary.  MyChart is used to connect  with patients for Virtual Visits (Telemedicine).  Patients are able to view lab/test results, encounter notes, upcoming appointments, etc.  Non-urgent messages can be sent to your provider as well.   To learn more about what you can do with MyChart, go to [email or web address].    Your next appointment:   6 week(s)  The format for your next appointment:   In Person  Provider:   Chilton Si, MD, Gillian Shields, NP, or Edd Fabian, NP    Other Instructions None today

## 2021-06-05 ENCOUNTER — Ambulatory Visit (INDEPENDENT_AMBULATORY_CARE_PROVIDER_SITE_OTHER): Payer: BC Managed Care – PPO

## 2021-06-05 ENCOUNTER — Other Ambulatory Visit: Payer: Self-pay

## 2021-06-05 ENCOUNTER — Encounter (HOSPITAL_BASED_OUTPATIENT_CLINIC_OR_DEPARTMENT_OTHER): Payer: Self-pay | Admitting: Family

## 2021-06-05 DIAGNOSIS — E782 Mixed hyperlipidemia: Secondary | ICD-10-CM | POA: Diagnosis not present

## 2021-06-05 DIAGNOSIS — R600 Localized edema: Secondary | ICD-10-CM

## 2021-06-05 LAB — ECHOCARDIOGRAM COMPLETE
AR max vel: 3.32 cm2
AV Area VTI: 3.51 cm2
AV Area mean vel: 3.03 cm2
AV Mean grad: 3 mmHg
AV Peak grad: 4.9 mmHg
AV Vena cont: 0.26 cm
Ao pk vel: 1.11 m/s
Area-P 1/2: 3.42 cm2
Calc EF: 43.9 %
P 1/2 time: 681 msec
S' Lateral: 4.03 cm
Single Plane A2C EF: 48 %
Single Plane A4C EF: 43.5 %

## 2021-06-05 MED ORDER — PERFLUTREN LIPID MICROSPHERE
1.0000 mL | INTRAVENOUS | Status: AC | PRN
  Administered 2021-06-05: 2 mL via INTRAVENOUS

## 2021-06-06 ENCOUNTER — Ambulatory Visit (HOSPITAL_BASED_OUTPATIENT_CLINIC_OR_DEPARTMENT_OTHER): Payer: BC Managed Care – PPO | Admitting: Cardiology

## 2021-06-06 ENCOUNTER — Telehealth: Payer: Self-pay | Admitting: Nurse Practitioner

## 2021-06-06 DIAGNOSIS — I5189 Other ill-defined heart diseases: Secondary | ICD-10-CM

## 2021-06-06 DIAGNOSIS — Z01812 Encounter for preprocedural laboratory examination: Secondary | ICD-10-CM

## 2021-06-06 MED ORDER — SPIRONOLACTONE 25 MG PO TABS: 12.5000 mg | ORAL_TABLET | Freq: Every day | ORAL | 3 refills | Status: DC

## 2021-06-06 NOTE — Telephone Encounter (Signed)
Reviewed echo results with patient and he would like to proceed with scheduling right and left heart catheterization for newly reduced left ventricular EF. He has a hx of CAD with prior stenting in 2010. The patient understands that risks include but are not limited to stroke (1 in 1000), death (1 in 1000), kidney failure [usually temporary] (1 in 500), bleeding (1 in 200), allergic reaction [possibly serious] (1 in 200), and agrees to proceed.   Please schedule patient for [facility]. He prefers a few days notice for his work schedule, otherwise his schedule is flexible. He will need cbc and bmet prior - please determine best location for him. I have added spironolactone 12.5 mg daily to his medicine regimen.

## 2021-06-06 NOTE — Telephone Encounter (Signed)
Patient returned your call.

## 2021-06-06 NOTE — Telephone Encounter (Signed)
Patient calling for his echo results.

## 2021-06-06 NOTE — Telephone Encounter (Signed)
Called pt to schedule cath. Left message to call back

## 2021-06-07 DIAGNOSIS — Z01812 Encounter for preprocedural laboratory examination: Secondary | ICD-10-CM | POA: Diagnosis not present

## 2021-06-07 DIAGNOSIS — I5189 Other ill-defined heart diseases: Secondary | ICD-10-CM | POA: Diagnosis not present

## 2021-06-07 NOTE — Telephone Encounter (Signed)
Left message to call back  

## 2021-06-07 NOTE — Telephone Encounter (Signed)
Cath scheduled for Tuesday 06/12/21 at 5:30 am with Dr. Katrinka Blazing.  Labs ordered (pt will stop by office today) Nurse went over medication and cath instructions with pt. Pt will also pick up a copy of instructions when he come for lab work.

## 2021-06-08 ENCOUNTER — Telehealth: Payer: Self-pay | Admitting: Nurse Practitioner

## 2021-06-08 LAB — BASIC METABOLIC PANEL
BUN/Creatinine Ratio: 15 (ref 9–20)
BUN: 15 mg/dL (ref 6–24)
CO2: 25 mmol/L (ref 20–29)
Calcium: 9 mg/dL (ref 8.7–10.2)
Chloride: 99 mmol/L (ref 96–106)
Creatinine, Ser: 0.99 mg/dL (ref 0.76–1.27)
Glucose: 72 mg/dL (ref 70–99)
Potassium: 3.9 mmol/L (ref 3.5–5.2)
Sodium: 138 mmol/L (ref 134–144)
eGFR: 88 mL/min/{1.73_m2} (ref >59)

## 2021-06-08 LAB — CBC
Hematocrit: 43.3 % (ref 37.5–51.0)
Hemoglobin: 15.2 g/dL (ref 13.0–17.7)
MCH: 32 pg (ref 26.6–33.0)
MCHC: 35.1 g/dL (ref 31.5–35.7)
MCV: 91 fL (ref 79–97)
Platelets: 274 10*3/uL (ref 150–450)
RBC: 4.75 x10E6/uL (ref 4.14–5.80)
RDW: 12.8 % (ref 11.6–15.4)
WBC: 8.4 10*3/uL (ref 3.4–10.8)

## 2021-06-08 NOTE — Telephone Encounter (Signed)
Called pt and left details describing that pt. Does not need to send a medlist for this procedure as it is a [facility] facility and can be seen day of

## 2021-06-08 NOTE — Telephone Encounter (Signed)
° °  Pt said the preadmission at [facility] hospital requested a copy of his meds list. He request to send a copy of [facility] at [facility] for his upcoming appt

## 2021-06-11 ENCOUNTER — Telehealth: Payer: Self-pay | Admitting: *Deleted

## 2021-06-11 NOTE — Telephone Encounter (Signed)
Call placed to patient to review procedure instructions, no answer, voicemail message. °

## 2021-06-11 NOTE — H&P (Signed)
History of CAD with stent 2010.  No chest discomfort since. Newly identified reduced LVEF of 40 to 45% with shortness of breath and lower extremity swelling. Scheduled for right and left heart cath.

## 2021-06-11 NOTE — Telephone Encounter (Signed)
Cardiac catheterization scheduled at [facility] for: Tuesday June 13, 2021 7:30 AM [facility] Main Entrance A [facility]) at: 5:30 AM   Diet-no solid food after midnight prior to cath, clear liquids until 5 AM day of procedure.  Medication instructions for procedure: -Hold:  Spironolactone-AM of procedure -Except hold medications usual morning medications can be taken pre-cath with sips of water including aspirin 81 mg and Plavix 75 mg.    Confirmed patient has responsible adult to drive home post procedure and be with patient first 24 hours after arriving home.  [facility] does allow one visitor to accompany you and wait in the hospital waiting room while you are there for your procedure. You and your visitor will be asked to wear a mask once you enter the hospital.   Patient reports does not currently have any new symptoms concerning for COVID-19 and no household members with COVID-19 like illness.     Left message to call back to review procedure instructions with patient.

## 2021-06-12 ENCOUNTER — Telehealth (HOSPITAL_BASED_OUTPATIENT_CLINIC_OR_DEPARTMENT_OTHER): Payer: Self-pay | Admitting: *Deleted

## 2021-06-12 ENCOUNTER — Ambulatory Visit (HOSPITAL_COMMUNITY)
Admission: RE | Admit: 2021-06-12 | Discharge: 2021-06-12 | Disposition: A | Discharge status: Home / Self Care | Payer: BC Managed Care – PPO | Attending: Interventional Cardiology | Admitting: Interventional Cardiology

## 2021-06-12 ENCOUNTER — Ambulatory Visit (HOSPITAL_COMMUNITY)
Admission: RE | Disposition: A | Discharge status: Home / Self Care | Payer: Self-pay | Source: Home / Self Care | Attending: Interventional Cardiology

## 2021-06-12 ENCOUNTER — Other Ambulatory Visit: Payer: Self-pay

## 2021-06-12 ENCOUNTER — Encounter (HOSPITAL_COMMUNITY): Payer: Self-pay | Admitting: Interventional Cardiology

## 2021-06-12 DIAGNOSIS — I110 Hypertensive heart disease with heart failure: Secondary | ICD-10-CM | POA: Insufficient documentation

## 2021-06-12 DIAGNOSIS — E782 Mixed hyperlipidemia: Secondary | ICD-10-CM | POA: Diagnosis not present

## 2021-06-12 DIAGNOSIS — I5022 Chronic systolic (congestive) heart failure: Secondary | ICD-10-CM

## 2021-06-12 DIAGNOSIS — I10 Essential (primary) hypertension: Secondary | ICD-10-CM | POA: Diagnosis present

## 2021-06-12 DIAGNOSIS — Z955 Presence of coronary angioplasty implant and graft: Secondary | ICD-10-CM | POA: Diagnosis not present

## 2021-06-12 DIAGNOSIS — Z79899 Other long term (current) drug therapy: Secondary | ICD-10-CM | POA: Insufficient documentation

## 2021-06-12 DIAGNOSIS — I70213 Atherosclerosis of native arteries of extremities with intermittent claudication, bilateral legs: Secondary | ICD-10-CM | POA: Insufficient documentation

## 2021-06-12 DIAGNOSIS — R0609 Other forms of dyspnea: Secondary | ICD-10-CM | POA: Insufficient documentation

## 2021-06-12 DIAGNOSIS — I5042 Chronic combined systolic (congestive) and diastolic (congestive) heart failure: Secondary | ICD-10-CM | POA: Diagnosis not present

## 2021-06-12 DIAGNOSIS — I2510 Atherosclerotic heart disease of native coronary artery without angina pectoris: Secondary | ICD-10-CM | POA: Diagnosis not present

## 2021-06-12 DIAGNOSIS — R001 Bradycardia, unspecified: Secondary | ICD-10-CM | POA: Insufficient documentation

## 2021-06-12 DIAGNOSIS — R072 Precordial pain: Secondary | ICD-10-CM | POA: Diagnosis present

## 2021-06-12 HISTORY — PX: RIGHT/LEFT HEART CATH AND CORONARY ANGIOGRAPHY: CATH118266

## 2021-06-12 LAB — POCT I-STAT EG7
Acid-Base Excess: 0 mmol/L (ref 0.0–2.0)
Bicarbonate: 25.1 mmol/L (ref 20.0–28.0)
Calcium, Ion: 1.2 mmol/L (ref 1.15–1.40)
HCT: 40 % (ref 39.0–52.0)
Hemoglobin: 13.6 g/dL (ref 13.0–17.0)
O2 Saturation: 78 %
Potassium: 4.2 mmol/L (ref 3.5–5.1)
Sodium: 139 mmol/L (ref 135–145)
TCO2: 26 mmol/L (ref 22–32)
pCO2, Ven: 43.4 mmHg — ABNORMAL LOW (ref 44.0–60.0)
pH, Ven: 7.37 (ref 7.250–7.430)
pO2, Ven: 44 mmHg (ref 32.0–45.0)

## 2021-06-12 LAB — POCT I-STAT 7, (LYTES, BLD GAS, ICA,H+H)
Acid-base deficit: 1 mmol/L (ref 0.0–2.0)
Bicarbonate: 24.7 mmol/L (ref 20.0–28.0)
Calcium, Ion: 1.22 mmol/L (ref 1.15–1.40)
HCT: 41 % (ref 39.0–52.0)
Hemoglobin: 13.9 g/dL (ref 13.0–17.0)
O2 Saturation: 100 %
Potassium: 4.2 mmol/L (ref 3.5–5.1)
Sodium: 138 mmol/L (ref 135–145)
TCO2: 26 mmol/L (ref 22–32)
pCO2 arterial: 43 mmHg (ref 32.0–48.0)
pH, Arterial: 7.367 (ref 7.350–7.450)
pO2, Arterial: 175 mmHg — ABNORMAL HIGH (ref 83.0–108.0)

## 2021-06-12 SURGERY — RIGHT/LEFT HEART CATH AND CORONARY ANGIOGRAPHY
Anesthesia: LOCAL

## 2021-06-12 MED ORDER — IOHEXOL 350 MG/ML SOLN
INTRAVENOUS | Status: DC | PRN
  Administered 2021-06-12: 08:00:00 55 mL

## 2021-06-12 MED ORDER — HEPARIN SODIUM (PORCINE) 1000 UNIT/ML IJ SOLN
INTRAMUSCULAR | Status: AC
  Filled 2021-06-12: qty 10

## 2021-06-12 MED ORDER — ACETAMINOPHEN 325 MG PO TABS: 650.0000 mg | ORAL_TABLET | ORAL | Status: DC | PRN

## 2021-06-12 MED ORDER — HEPARIN (PORCINE) IN NACL 1000-0.9 UT/500ML-% IV SOLN
INTRAVENOUS | Status: AC
  Filled 2021-06-12: qty 1000

## 2021-06-12 MED ORDER — FENTANYL CITRATE (PF) 100 MCG/2ML IJ SOLN
INTRAMUSCULAR | Status: AC
  Filled 2021-06-12: qty 2

## 2021-06-12 MED ORDER — SODIUM CHLORIDE 0.9 % IV SOLN: 250.0000 mL | INTRAVENOUS | Status: DC | PRN

## 2021-06-12 MED ORDER — SODIUM CHLORIDE 0.9 % WEIGHT BASED INFUSION
3.0000 mL/kg/h | INTRAVENOUS | Status: AC
  Administered 2021-06-12: 06:00:00 3 mL/kg/h via INTRAVENOUS

## 2021-06-12 MED ORDER — ASPIRIN 81 MG PO CHEW: 81.0000 mg | CHEWABLE_TABLET | ORAL | Status: AC

## 2021-06-12 MED ORDER — LIDOCAINE HCL (PF) 1 % IJ SOLN
INTRAMUSCULAR | Status: AC
  Filled 2021-06-12: qty 30

## 2021-06-12 MED ORDER — VERAPAMIL HCL 2.5 MG/ML IV SOLN
INTRAVENOUS | Status: DC | PRN
  Administered 2021-06-12: 08:00:00 10 mL via INTRA_ARTERIAL

## 2021-06-12 MED ORDER — LABETALOL HCL 5 MG/ML IV SOLN: 10.0000 mg | INTRAVENOUS | Status: DC | PRN

## 2021-06-12 MED ORDER — MIDAZOLAM HCL 2 MG/2ML IJ SOLN
INTRAMUSCULAR | Status: DC | PRN
  Administered 2021-06-12: 1 mg via INTRAVENOUS
  Administered 2021-06-12: .5 mg via INTRAVENOUS

## 2021-06-12 MED ORDER — HEPARIN SODIUM (PORCINE) 1000 UNIT/ML IJ SOLN
INTRAMUSCULAR | Status: DC | PRN
  Administered 2021-06-12: 6000 [IU] via INTRAVENOUS

## 2021-06-12 MED ORDER — HEPARIN (PORCINE) IN NACL 1000-0.9 UT/500ML-% IV SOLN
INTRAVENOUS | Status: DC | PRN
  Administered 2021-06-12 (×2): 500 mL

## 2021-06-12 MED ORDER — ONDANSETRON HCL 4 MG/2ML IJ SOLN: 4.0000 mg | Freq: Four times a day (QID) | INTRAMUSCULAR | Status: DC | PRN

## 2021-06-12 MED ORDER — MIDAZOLAM HCL 2 MG/2ML IJ SOLN
INTRAMUSCULAR | Status: AC
  Filled 2021-06-12: qty 2

## 2021-06-12 MED ORDER — VERAPAMIL HCL 2.5 MG/ML IV SOLN
INTRAVENOUS | Status: AC
  Filled 2021-06-12: qty 2

## 2021-06-12 MED ORDER — FENTANYL CITRATE (PF) 100 MCG/2ML IJ SOLN
INTRAMUSCULAR | Status: DC | PRN
  Administered 2021-06-12 (×2): 25 ug via INTRAVENOUS

## 2021-06-12 MED ORDER — SODIUM CHLORIDE 0.9% FLUSH: 3.0000 mL | INTRAVENOUS | Status: DC | PRN

## 2021-06-12 MED ORDER — SODIUM CHLORIDE 0.9% FLUSH: 3.0000 mL | Freq: Two times a day (BID) | INTRAVENOUS | Status: DC

## 2021-06-12 MED ORDER — HYDRALAZINE HCL 20 MG/ML IJ SOLN: 10.0000 mg | INTRAMUSCULAR | Status: DC | PRN

## 2021-06-12 MED ORDER — SODIUM CHLORIDE 0.9 % IV SOLN: INTRAVENOUS | Status: DC

## 2021-06-12 MED ORDER — SODIUM CHLORIDE 0.9 % WEIGHT BASED INFUSION: 1.0000 mL/kg/h | INTRAVENOUS | Status: DC

## 2021-06-12 SURGICAL SUPPLY — 12 items
CATH 5FR JL3.5 JR4 ANG PIG MP (CATHETERS) ×2 IMPLANT
CATH BALLN WEDGE 5F 110CM (CATHETERS) ×2 IMPLANT
DEVICE RAD COMP TR BAND LRG (VASCULAR PRODUCTS) ×2 IMPLANT
GLIDESHEATH SLEND A-KIT 6F 22G (SHEATH) ×2 IMPLANT
GUIDEWIRE .025 260CM (WIRE) ×2 IMPLANT
GUIDEWIRE INQWIRE 1.5J.035X260 (WIRE) IMPLANT
INQWIRE 1.5J .035X260CM (WIRE) ×3
KIT HEART LEFT (KITS) ×3 IMPLANT
PACK CARDIAC CATHETERIZATION (CUSTOM PROCEDURE TRAY) ×3 IMPLANT
SHEATH GLIDE SLENDER 4/5FR (SHEATH) ×2 IMPLANT
TRANSDUCER W/STOPCOCK (MISCELLANEOUS) ×3 IMPLANT
TUBING CIL FLEX 10 FLL-RA (TUBING) ×3 IMPLANT

## 2021-06-12 NOTE — Telephone Encounter (Signed)
° °  Pre-operative Risk Assessment    Patient Name: Blake Gomez  DOB: 1961/06/08 MRN: [id number]      Request for Surgical Clearance    Procedure:   Left Knee Arthroscopy and Debridement  Date of Surgery:  Clearance TBD                                  Surgeon:  Unknown Surgeon's Group or Practice Name:  Aldean Baker Phone number:  [phone] Fax number:  [phone]   Type of Clearance Requested:   Medical- Aspirin   Type of Anesthesia:   Regional Block   Additional requests/questions:    [name]   06/12/2021, 11:39 AM

## 2021-06-12 NOTE — CV Procedure (Signed)
Widely patent coronary arteries.  LAD stent has less than 20% restenosis. Low normal to mildly reduced LVEF at 45 to 50%.  Elevated LVEDP 22 mmHg. Pulmonary pressures are normal.  PA mean of 17 mmHg.  Mean wedge pressure 7 mmHg. Chronic combined systolic and diastolic heart failure (LVEDP 22 mmHg).  Wedge pressure is normal.  Needs guideline directed therapy for diastolic and systolic dysfunction.

## 2021-06-12 NOTE — Telephone Encounter (Signed)
Patient had echocardiogram with subsequent cardiac catheterization on 06/12/21 that revealed combined systolic and diastolic HF. Would await follow-up appointment 07/03/21 for GDMT for HF before we provide additional clearance.  With widely patent coronary arteries, widely patent LAD stent will defer decision regarding holding aspirin to primary cardiologist.

## 2021-06-12 NOTE — Interval H&P Note (Signed)
Cath Lab Visit (complete for each Cath Lab visit)  Clinical Evaluation Leading to the Procedure:   ACS: No.  Non-ACS:    Anginal Classification: CCS II  Anti-ischemic medical therapy: Minimal Therapy (1 class of medications)  Non-Invasive Test Results: Intermediate-risk stress test findings: cardiac mortality 1-3%/year  Prior CABG: No previous CABG      History and Physical Interval Note:  06/12/2021 7:18 AM  Blake Gomez  has presented today for surgery, with the diagnosis of newly reduced EF.  The various methods of treatment have been discussed with the patient and family. After consideration of risks, benefits and other options for treatment, the patient has consented to  Procedure(s): RIGHT/LEFT HEART CATH AND CORONARY ANGIOGRAPHY (N/A) as a surgical intervention.  The patient's history has been reviewed, patient examined, no change in status, stable for surgery.  I have reviewed the patient's chart and labs.  Questions were answered to the patient's satisfaction.     Lyn Records III

## 2021-06-13 MED FILL — Lidocaine HCl Local Preservative Free (PF) Inj 1%: INTRAMUSCULAR | Qty: 30 | Status: AC

## 2021-06-22 NOTE — Telephone Encounter (Signed)
Patient has an upcoming appointment on 07/03/21 with Gillian Shields, NP. Will route clearance to her to address on the date of his visit and to get clearance from Dr. Duke Salvia regarding holding aspirin prior to surgery.

## 2021-07-01 NOTE — Progress Notes (Signed)
Office Visit    Patient Name: Blake Gomez Date of Encounter: 07/03/2021  PCP:  Orma Render, NP   [place]  Cardiologist:  Skeet Latch, MD  Advanced Practice Provider:  No care team member to display Electrophysiologist:  None     Chief Complaint    Blake Gomez is a 61 y.o. male with a hx of  combined systolic and diastolic heart failure, HTN, HLD,  CAD (stent to ostial-prox LAD in 2014), GERD, obesity presents today for follow up after [facility]   Past Medical History    Past Medical History:  Diagnosis Date   Bronchitis, mucopurulent recurrent ([place])    CAD (coronary artery disease)    stent 2014.  No MI.  Echo normal.   Chronic renal insufficiency, stage 2 (mild)    GERD (gastroesophageal reflux disease)    Hay fever    Hematochezia 2017; 2019   Hematochezia/screening: 2017 Colonoscopy normal except adenomatous polyp.  2019 colonoscopy, +polyp, recall 2022.   History of adenomatous polyp of colon 2017; 2019   Recall 2022   History of hematuria    per old records   Hypercholesterolemia    Hypertension    Left knee pain 2021   MRI 10/2019-> moderate DJD medial mostly, +deg tear of medial and lateral menisci, +effusion, +popliteal cyst->recommended ortho   Obesity, Class II, BMI 35-39.9    Unilateral primary osteoarthritis, left knee    + deg menisc tears: Dr. Marlou Sa, steroid inj 11/2019, pt wants to avoid surgery   Past Surgical History:  Procedure Laterality Date   CARDIAC CATHETERIZATION  09/2008   95% LAD lesion-->DES   CARDIOVASCULAR STRESS TEST  2015   cardiolyte->no ischemia.  EF 60%.   COLONOSCOPY  2017/2019   Hematochezia/screening: 2017 Colonoscopy normal except adenomatous polyp.  2019 colonoscopy, +polyp, recall 2022.   CORONARY ANGIOPLASTY WITH STENT PLACEMENT  2010   DES to LAD   RIGHT/LEFT HEART CATH AND CORONARY ANGIOGRAPHY N/A 06/12/2021   Procedure: RIGHT/LEFT HEART CATH AND CORONARY ANGIOGRAPHY;  Surgeon: Belva Crome,  MD;  Location: [facility];  Service: Cardiovascular;  Laterality: N/A;    Allergies  No Known Allergies  History of Present Illness    Blake Gomez is a 61 y.o. male with a hx of  combined systolic and diastolic heart failure, HTN, HLD,  CAD (stent to ostial-prox LAD in 2014), GERD, obesity  last seen for [facility] 06/12/21.  Previous stent placement at [facility] in [place], [place]. Seen as new patient 09/2019 by Dr. Bettina Gavia. He was doing well from cardiac perspective. He was recommended to continue DAPT and follow up in 1year.   Seen 05/22/21 by Christen Bame, NP for clearance for knee surgery as well as decreased pulses noted by PCP. He noted dyspnea on exertion, LE edema, PND. Metoprolol dose was reduced due to bradycardia. He was recommended for ABI which were normal. Echo showed LVEF 40-45%, gr1DD, mildly enlarged RV, mild MR/AI. Subsequent LHC to rule out ICM performed 06/12/21 showed widely patent coronary arteries, ostial-prox LAD stent patient with minimal luminal irregularities in the mid LAD. LVEF 45-50% with LVEDP 72mmHg consistent with combined CHF. He was recommended for optimization of heart failure therapy.   He presents today for follow up with his mother. We reviewed cardiac testing in detail as well as diagnosis of heart failure. He endorses fatigues more easily with activity which he attributes to new finding of heart failure.  Reports no shortness of  breath and stable dyspnea on exertion. Reports no chest pain, pressure, or tightness. No edema, orthopnea, PND. Reports no palpitations.  Notes persistent numbness, bilateral LE pain. Hopeful upcoming knee surgery will provide improvement.   EKGs/Labs/Other Studies Reviewed:   The following studies were reviewed today:  LHC 06/12/21   CONCLUSIONS: Widely patent coronary arteries.  Right dominant anatomy. Ostial to proximal LAD stent is widely patent.  Minimal luminal irregularities are noted in the mid LAD. Low  normal to mildly depressed LVEF at 45 to 50%.  LVEDP 22 mmHg.  Consistent with chronic combined systolic and diastolic heart failure.  Left ventriculography was by hand-injection. Normal pulmonary artery pressures.  Mean PA pressure 17 mmHg.  Capillary wedge pressure 7 mmHg.   RECOMMENDATIONS:   Guideline directed therapy for combined systolic and diastolic heart failure, especially considering SGLT2 therapy as an add-on to current regimen.   Coronary Findings  Diagnostic Dominance: Right Left Anterior Descending  Vessel is small.  Ost LAD to Mid LAD lesion is 15% stenosed. The lesion was previously treated .  Mid LAD lesion is 25% stenosed.    Second Diagonal Branch  2nd Diag lesion is 20% stenosed.     Right Heart  Right Heart Pressures Hemodynamic findings consistent with pulmonary hypertension. Elevated LV EDP consistent with volume overload.    Left Heart  Left Ventricle The left ventricular size is normal. There is mild left ventricular systolic dysfunction. LV end diastolic pressure is mildly elevated. The left ventricular ejection fraction is 45-50% by visual estimate. There are LV function abnormalities due to global hypokinesis.   Dominance: Right   LE ABI 06/05/21  Summary:  Right: Resting right ankle-brachial index is within normal range. No  evidence of significant right lower extremity arterial disease. The right  toe-brachial index is normal.   Left: Resting left ankle-brachial index is within normal range. No  evidence of significant left lower extremity arterial disease. The left  toe-brachial index is normal.   Echo 06/05/21   1. Left ventricular ejection fraction, by estimation, is 40 to 45%. The  left ventricle has mildly decreased function. The left ventricle  demonstrates global hypokinesis. Left ventricular diastolic parameters are  consistent with Grade I diastolic  dysfunction (impaired relaxation).   2. Right ventricular systolic function  is normal. The right ventricular  size is mildly enlarged.   3. Left atrial size was mildly dilated.   4. The mitral valve is normal in structure. Mild mitral valve  regurgitation. No evidence of mitral stenosis.   5. The aortic valve is normal in structure. Aortic valve regurgitation is  mild. No aortic stenosis is present.   6. The inferior vena cava is normal in size with greater than 50%  respiratory variability, suggesting right atrial pressure of 3 mmHg.   EKG:  No EKG today.  Recent Labs: 05/03/2021: ALT 23 06/07/2021: BUN 15; Creatinine, Ser 0.99; Platelets 274 06/12/2021: Hemoglobin 13.9; Potassium 4.2; Sodium 138  Recent Lipid Panel    Component Value Date/Time   CHOL 205 (H) 05/03/2021 1025   TRIG 176 (H) 05/03/2021 1025   HDL 34 (L) 05/03/2021 1025   CHOLHDL 6.0 (H) 05/03/2021 1025   LDLCALC 139 (H) 05/03/2021 1025    Home Medications   Current Meds  Medication Sig   aspirin 81 MG EC tablet Take 1 tablet (81 mg total) by mouth daily. Swallow whole.   atorvastatin (LIPITOR) 80 MG tablet Take once a day for cholesterol   clopidogrel (PLAVIX) 75 MG  tablet Take one tablet a day for artery disease   dapagliflozin propanediol (FARXIGA) 10 MG TABS tablet Take 1 tablet (10 mg total) by mouth daily before breakfast.   Fish Oil-Krill Oil (MEGARED ADVANCED 4 IN 1 PO) Take by mouth daily.   folic acid (FOLVITE) Q000111Q MCG tablet folic acid Q000111Q mcg tablet  Take 1 tablet every day by oral route.   ketoconazole (NIZORAL) 2 % shampoo Apply 1 application topically 2 (two) times a week.   metoprolol succinate (TOPROL-XL) 25 MG 24 hr tablet Take 0.5 tablets (12.5 mg total) by mouth daily.   Multiple Vitamins-Minerals (CENTRUM SILVER PO) Centrum Silver Men 300 mcg-600 mcg-300 mcg tablet  Take 1 tablet every day by oral route.   nitroGLYCERIN (NITROSTAT) 0.4 MG SL tablet Place 1 tablet (0.4 mg total) under the tongue every 5 (five) minutes as needed for chest pain.   oxyCODONE (OXY  IR/ROXICODONE) 5 MG immediate release tablet Take 1 tablet (5 mg total) by mouth every 4 (four) hours as needed (severe pain).   spironolactone (ALDACTONE) 25 MG tablet Take 0.5 tablets (12.5 mg total) by mouth daily.     Review of Systems    All other systems reviewed and are otherwise negative except as noted above.  Physical Exam    VS:  BP 134/74    Pulse (!) 59    Ht 5\' 10"  (1.778 m)    Wt 281 lb 12.8 oz (127.8 kg)    SpO2 97%    BMI 40.43 kg/m  , BMI Body mass index is 40.43 kg/m.  Wt Readings from Last 3 Encounters:  07/03/21 281 lb 12.8 oz (127.8 kg)  06/12/21 270 lb (122.5 kg)  05/22/21 280 lb (127 kg)    GEN: Well nourished, overweight, well developed, in no acute distress. HEENT: normal. Neck: Supple, no JVD, carotid bruits, or masses. Cardiac: RRR, no murmurs, rubs, or gallops. No clubbing, cyanosis, edema.  Radials/PT 2+ and equal bilaterally.  Respiratory:  Respirations regular and unlabored, clear to auscultation bilaterally. GI: Soft, nontender, nondistended. MS: No deformity or atrophy. Skin: Warm and dry, no rash. Neuro:  Strength and sensation are intact. Psych: Normal affect.  Assessment & Plan    Preop clearance - Upcoming knee surgery with Dr. Sammuel Hines. According to the Revised Cardiac Risk Index (RCRI), his Perioperative Risk of Major Cardiac Event is (%): 11. His  Functional Capacity in METs is: 5.07 according to the Duke Activity Status Index (DASI). He is deemed acceptable risk for the planned procedure without additional cardiovascular testing. He may hold Plavix perioperatively at the direction of surgical team. Ideally, given previous coronary stenting would continue aspirin throughout the perioperative period if possible. Will route to surgical team so they are aware.  CAD - LHC 05/2021 with patent DES to LAD. GDMT includes Aspirin, plavix, Atorvastatin, Metoprolol. Heart healthy diet and regular cardiovascular exercise encouraged.    Combined systolic  and diastolic heart failure - Echo 05/2021 LVEF 40-45%. LHC 05/2021 LVEF 45-50%, patent stent with otherwise nonobstructive disease. Lifestyle changes including low salt diet, fluid restriction <2L encouraged. GDMT includes Metoprolol Succinate, Spironolactone. For optimization of GDMT, will add Farxiga 10mg  daily. Repeat BMET in 1-2 weeks for monitoring. If renal function normal, consider increased dose Spironolactone. Future considerations include Entresto/ARB though BP may not tolerate. Consider repeat echocardiogram 3 months after optimization of GDMT.   HLD, LDL goal <70 - 04/2021 total cholesterol 205, HDL 34, LDL 139, triglycerides 176. Upcoming repeat labs with PCP per his  report. If LDL not at goal of <70, consider addition of Zetia vs referral to lipid clinic for addition of PSCK9i.  HTN - BP mildly elevated today though recent clinic visit at goal <130/80. Add Wilder Glade, as detailed above.   Obesity - Weight loss via diet and exercise encouraged. Discussed the impact being overweight would have on cardiovascular risk including heart failure and CAD.  Disposition: Follow up in 3 month(s) with Skeet Latch, MD or APP.  Signed, Loel Dubonnet, NP 07/03/2021, 9:18 AM [place]

## 2021-07-03 ENCOUNTER — Encounter (HOSPITAL_BASED_OUTPATIENT_CLINIC_OR_DEPARTMENT_OTHER): Payer: Self-pay | Admitting: Family

## 2021-07-03 ENCOUNTER — Other Ambulatory Visit: Payer: Self-pay

## 2021-07-03 ENCOUNTER — Ambulatory Visit (HOSPITAL_BASED_OUTPATIENT_CLINIC_OR_DEPARTMENT_OTHER): Payer: BC Managed Care – PPO | Admitting: Family

## 2021-07-03 VITALS — BP 134/74 | HR 59 | Ht 70.0 in | Wt 281.8 lb

## 2021-07-03 DIAGNOSIS — Z01810 Encounter for preprocedural cardiovascular examination: Secondary | ICD-10-CM

## 2021-07-03 DIAGNOSIS — I25118 Atherosclerotic heart disease of native coronary artery with other forms of angina pectoris: Secondary | ICD-10-CM | POA: Diagnosis not present

## 2021-07-03 DIAGNOSIS — I10 Essential (primary) hypertension: Secondary | ICD-10-CM

## 2021-07-03 DIAGNOSIS — I5022 Chronic systolic (congestive) heart failure: Secondary | ICD-10-CM | POA: Diagnosis not present

## 2021-07-03 DIAGNOSIS — E785 Hyperlipidemia, unspecified: Secondary | ICD-10-CM

## 2021-07-03 MED ORDER — DAPAGLIFLOZIN PROPANEDIOL 10 MG PO TABS: 10.0000 mg | ORAL_TABLET | Freq: Every day | ORAL | 6 refills | Status: DC

## 2021-07-03 NOTE — Patient Instructions (Addendum)
Medication Instructions:  Your physician has recommended you make the following change in your medication:   Start: Farxiga 10mg  tablet Daily    *If you need a refill on your cardiac medications before your next appointment, please call your pharmacy*   Lab Work: Your physician recommends that you return for lab work in 1-2 weeks for BMET  If you have labs (blood work) drawn today and your tests are completely normal, you will receive your results only by: [employer] (if you have MyChart) OR A paper copy in the mail If you have any lab test that is abnormal or we need to change your treatment, we will call you to review the results.   Testing/Procedures: You are cleared for surgery and Laurann [place], NP will send Dr. Sammuel Hines a message letting his office letting them know you are cleared.    Follow-Up: At [facility], you and your health needs are our priority.  As part of our continuing mission to provide you with exceptional heart care, we have created designated Provider Care Teams.  These Care Teams include your primary Cardiologist (physician) and Advanced Practice Providers (APPs -  Physician Assistants and Nurse Practitioners) who all work together to provide you with the care you need, when you need it.  We recommend signing up for the patient portal called "MyChart".  Sign up information is provided on this After Visit Summary.  MyChart is used to connect with patients for Virtual Visits (Telemedicine).  Patients are able to view lab/test results, encounter notes, upcoming appointments, etc.  Non-urgent messages can be sent to your provider as well.   To learn more about what you can do with MyChart, go to [email or web address].    Your next appointment:   April 20th at 8am with Dr. Oval Linsey   Heart Healthy Diet Recommendations: A low-salt diet is recommended. Meats should be grilled, baked, or boiled. Avoid fried foods. Focus on lean protein sources like fish or  chicken with vegetables and fruits. The American Heart Association is a Microbiologist!  American Heart Association Diet and Lifeystyle Recommendations   Recommend reducing your fluid intake to less than 2L of fluid intake.   Exercise recommendations: The American Heart Association recommends 150 minutes of moderate intensity exercise weekly. Try 30 minutes of moderate intensity exercise 4-5 times per week. This could include walking, jogging, or swimming.    Heart Failure, Diagnosis Heart failure means that your heart is not able to pump blood in the right way. This makes it hard for your body to work well. Heart failure is usually a long-term (chronic) condition. You must take good care of yourself and follow your treatment plan from your doctor. Different stages of heart failure have different treatment plans. The stages are: Stage A: At risk for heart failure. Stage B: Pre-heart failure. Stage C: Symptomatic heart failure. Stage D: Advanced heart failure. What are the causes? High blood pressure. Buildup of cholesterol and fat in the arteries. Heart attack. This injures the heart muscle. Heart valves that do not open and close properly. Damage of the heart muscle. This is also called cardiomyopathy. Infection of the heart muscle. This is also called myocarditis. Lung disease. What increases the risk? Getting older. The risk of heart failure goes up as a person ages. Being overweight. Using tobacco or nicotine products. Abusing alcohol or drugs. Having taken medicines that can damage the heart. Having any of these conditions: Diabetes. Abnormal heart rhythms. Thyroid problems. Low blood counts (anemia). Having a  family history of heart failure. What are the signs or symptoms? Shortness of breath. Coughing. Swelling of the feet, ankles, legs, or belly. Losing or gaining weight for no reason. Trouble breathing. Waking from sleep because of the need to sit up and get more  air. Fast heartbeat. Other symptoms may include: Being very tired. Feeling dizzy, or feeling like you may pass out (faint). Having no desire to eat. Feeling like you may vomit (nauseous). Peeing (urinating) more at night. Feeling confused. How is this treated? This condition may be treated with: Medicines. These can be given to treat blood pressure and to make the heart muscles stronger. Changes in your daily life. These may include: Eating a healthy diet. Staying at a healthy body weight. Quitting tobacco, alcohol, and drug use. Doing exercises. Participating in a cardiac rehabilitation program. This program helps you improve your health through exercise, education, and counseling. Surgery. Surgery can be done to open blocked valves or to put devices in the heart, such as pacemakers. A donor heart (heart transplant). You will receive a healthy heart from a donor. Follow these instructions at home: Treat other conditions as told by your doctor. These may include high blood pressure, diabetes, thyroid disease, or abnormal heart rhythms. Learn as much as you can about heart failure. Get support as you need it. Keep all follow-up visits. Where to find more information American Heart Association: www.heart.org Centers for Disease Control and Prevention: http://www.wolf.info/ National Institute on Aging: http://kim-miller.com/ Summary Heart failure means that your heart is not able to pump blood in the right way. This condition is often caused by high blood pressure, heart attack, or damage of the heart muscle. Symptoms of this condition include shortness of breath and swelling of the feet, ankles, legs, or belly. You may also feel very tired or feel like you may vomit. You may be treated with medicines, surgery, or changes in your daily life. Treat other health conditions as told by your doctor. This information is not intended to replace advice given to you by your health care provider. Make sure you  discuss any questions you have with your health care provider. Document Revised: 12/07/2020 Document Reviewed: 01/01/2020 Elsevier Patient Education  [place].

## 2021-07-10 DIAGNOSIS — I5022 Chronic systolic (congestive) heart failure: Secondary | ICD-10-CM | POA: Diagnosis not present

## 2021-07-10 DIAGNOSIS — Z01810 Encounter for preprocedural cardiovascular examination: Secondary | ICD-10-CM | POA: Diagnosis not present

## 2021-07-11 ENCOUNTER — Encounter (HOSPITAL_COMMUNITY): Payer: Self-pay | Admitting: Orthopaedic Surgery

## 2021-07-11 ENCOUNTER — Telehealth (HOSPITAL_BASED_OUTPATIENT_CLINIC_OR_DEPARTMENT_OTHER): Payer: Self-pay

## 2021-07-11 ENCOUNTER — Other Ambulatory Visit: Payer: Self-pay

## 2021-07-11 DIAGNOSIS — I10 Essential (primary) hypertension: Secondary | ICD-10-CM

## 2021-07-11 LAB — BASIC METABOLIC PANEL
BUN/Creatinine Ratio: 17 (ref 9–20)
BUN: 18 mg/dL (ref 6–24)
CO2: 24 mmol/L (ref 20–29)
Calcium: 9.5 mg/dL (ref 8.7–10.2)
Chloride: 101 mmol/L (ref 96–106)
Creatinine, Ser: 1.03 mg/dL (ref 0.76–1.27)
Glucose: 77 mg/dL (ref 70–99)
Potassium: 4.6 mmol/L (ref 3.5–5.2)
Sodium: 138 mmol/L (ref 134–144)
eGFR: 84 mL/min/{1.73_m2} (ref >59)

## 2021-07-11 MED ORDER — SPIRONOLACTONE 25 MG PO TABS: 25.0000 mg | ORAL_TABLET | Freq: Every day | ORAL | 3 refills | Status: DC

## 2021-07-11 NOTE — Progress Notes (Signed)
Anesthesia Chart Review: Same day workup  Follows with cardiology for history of combined systolic and diastolic heart failure (EF 40-45% by echo 05/2021), HTN, HLD, CAD (stent to ostial-prox LAD 2014).  LHC 05/2021 with patent DES to LAD. Last seen 07/03/2021 for preop clearance.  Per note, "Upcoming knee surgery with Dr. Sammuel Hines. According to the Revised Cardiac Risk Index (RCRI), his Perioperative Risk of Major Cardiac Event is (%): 11. His  Functional Capacity in METs is: 5.07 according to the Duke Activity Status Index (DASI). He is deemed acceptable risk for the planned procedure without additional cardiovascular testing. He may hold Plavix perioperatively at the direction of surgical team. Ideally, given previous coronary stenting would continue aspirin throughout the perioperative period if possible. Will route to surgical team so they are aware."  Preop labs reviewed, unremarkable.  EKG 05/12/21: Sinus bradycardia. Rate 51.  Cath 06/12/2021: CONCLUSIONS: Widely patent coronary arteries.  Right dominant anatomy. Ostial to proximal LAD stent is widely patent.  Minimal luminal irregularities are noted in the mid LAD. Low normal to mildly depressed LVEF at 45 to 50%.  LVEDP 22 mmHg.  Consistent with chronic combined systolic and diastolic heart failure.  Left ventriculography was by hand-injection. Normal pulmonary artery pressures.  Mean PA pressure 17 mmHg.  Capillary wedge pressure 7 mmHg.   RECOMMENDATIONS:   Guideline directed therapy for combined systolic and diastolic heart failure, especially considering SGLT2 therapy as an add-on to current regimen.  TTE 06/05/2021:  1. Left ventricular ejection fraction, by estimation, is 40 to 45%. The  left ventricle has mildly decreased function. The left ventricle  demonstrates global hypokinesis. Left ventricular diastolic parameters are  consistent with Grade I diastolic  dysfunction (impaired relaxation).   2. Right ventricular systolic  function is normal. The right ventricular  size is mildly enlarged.   3. Left atrial size was mildly dilated.   4. The mitral valve is normal in structure. Mild mitral valve  regurgitation. No evidence of mitral stenosis.   5. The aortic valve is normal in structure. Aortic valve regurgitation is  mild. No aortic stenosis is present.   6. The inferior vena cava is normal in size with greater than 50%  respiratory variability, suggesting right atrial pressure of 3 mmHg.     Wynonia Musty [facility] Short Stay Center/Anesthesiology Phone [phone] 07/11/2021 2:28 PM

## 2021-07-11 NOTE — Telephone Encounter (Addendum)
Results released via my chart and called to patient. No answer, results with call back number left on VM. Orders placed and lab slips mailed to patient!      ----- Message from Loel Dubonnet, NP sent at 07/11/2021  8:25 AM EST ----- Normal kidney function and electrolytes. For continued optimization of heart failure therapies recommend increase Spironolactone to 25mg  QD and repeat BMP in 2 weeks.   Of note, he does have surgery scheduled for 07/12/21. Would be okay to wait to change dose until after surgery.

## 2021-07-11 NOTE — Anesthesia Preprocedure Evaluation (Addendum)
Anesthesia Evaluation  Patient identified by MRN, date of birth, ID band Patient awake    Reviewed: Allergy & Precautions, NPO status , Patient's Chart, lab work & pertinent test results  History of Anesthesia Complications Negative for: history of anesthetic complications  Airway Mallampati: I  TM Distance: >3 FB Neck ROM: Full    Dental  (+) Teeth Intact, Dental Advisory Given   Pulmonary neg pulmonary ROS,    Pulmonary exam normal        Cardiovascular METS (5.07 by DASI): 5 - 7 Mets hypertension, Pt. on medications and Pt. on home beta blockers + CAD, + Cardiac Stents (2014) and +CHF  Normal cardiovascular exam   Echo 06/05/21: EF 40-45%, global hypokinesis, g1dd, nl RVSF, mild MR, mild AR  Cath 06/12/21: widely patent coronary arteries, widely patent LAD stent, low normal to mildly depressed LVEF at Q000111Q, combined systolic/diastolic HF, normal PA pressures   Neuro/Psych negative neurological ROS     GI/Hepatic Neg liver ROS, GERD  ,  Endo/Other  Morbid obesity  Renal/GU Renal InsufficiencyRenal disease (CKD2)  negative genitourinary   Musculoskeletal negative musculoskeletal ROS (+)   Abdominal   Peds  Hematology negative hematology ROS (+) Plavix   Anesthesia Other Findings   Reproductive/Obstetrics                           Anesthesia Physical Anesthesia Plan  ASA: 3  Anesthesia Plan: General   Post-op Pain Management: Tylenol PO (pre-op)   Induction: Intravenous  PONV Risk Score and Plan: 2 and Ondansetron, Dexamethasone, Midazolam and Treatment may vary due to age or medical condition  Airway Management Planned: LMA  Additional Equipment: None  Intra-op Plan:   Post-operative Plan: Extubation in OR  Informed Consent: I have reviewed the patients History and Physical, chart, labs and discussed the procedure including the risks, benefits and alternatives for the  proposed anesthesia with the patient or authorized representative who has indicated his/her understanding and acceptance.     Dental advisory given  Plan Discussed with:   Anesthesia Plan Comments: (PAT note by Karoline Caldwell, PA-C: Follows with cardiology for history of combined systolic and diastolic heart failure (EF 40-45% by echo 05/2021), HTN, HLD, CAD (stent to ostial-prox LAD 2014). LHC 05/2021 with patent DES to LAD. Last seen 07/03/2021 for preop clearance.  Per note, "Upcoming knee surgery with Dr. Sammuel Hines.According to the Revised Cardiac Risk Index (RCRI),hisPerioperative Risk of Major Cardiac Event is (%): 11. HisFunctional Capacity in METs is: 5.07according to the [facility] Activity Status Index (DASI).He is deemed acceptable risk for the planned procedure without additional cardiovascular testing. He may hold Plavix perioperatively at the direction of surgical team. Ideally, given previous coronary stenting would continue aspirin throughout the perioperative period if possible. Will route to surgical team so they are aware."  Preop labs reviewed, unremarkable.  EKG 05/12/21: Sinus bradycardia. Rate 51.  Cath 06/12/2021: CONCLUSIONS:  Widely patent coronary arteries. Right dominant anatomy.  Ostial to proximal LAD stent is widely patent. Minimal luminal irregularities are noted in the mid LAD.  Low normal to mildly depressed LVEF at 45 to 50%. LVEDP 22 mmHg. Consistent with chronic combined systolic and diastolic heart failure. Left ventriculography was by hand-injection.  Normal pulmonary artery pressures. Mean PA pressure 17 mmHg. Capillary wedge pressure 7 mmHg.  RECOMMENDATIONS:   Guideline directed therapy for combined systolic and diastolic heart failure, especially considering SGLT2 therapy as an add-on to current regimen.  TTE 06/05/2021:  1. Left ventricular ejection fraction, by estimation, is 40 to 45%. The  left ventricle has mildly decreased  function. The left ventricle  demonstrates global hypokinesis. Left ventricular diastolic parameters are  consistent with Grade I diastolic  dysfunction (impaired relaxation).  2. Right ventricular systolic function is normal. The right ventricular  size is mildly enlarged.  3. Left atrial size was mildly dilated.  4. The mitral valve is normal in structure. Mild mitral valve  regurgitation. No evidence of mitral stenosis.  5. The aortic valve is normal in structure. Aortic valve regurgitation is  mild. No aortic stenosis is present.  6. The inferior vena cava is normal in size with greater than 50%  respiratory variability, suggesting right atrial pressure of 3 mmHg.  )      Anesthesia Quick Evaluation

## 2021-07-11 NOTE — Progress Notes (Signed)
DUE TO COVID-19 ONLY ONE VISITOR IS ALLOWED TO COME WITH YOU AND STAY IN THE WAITING ROOM ONLY DURING PRE OP AND PROCEDURE DAY OF SURGERY.   PCP - Nadyne Coombes, NP Cardiologist - Gillian Shields, NP.  Has appt to see Dr Chilton Si  Chest x-ray - n/a EKG - 05/22/21 Stress Test - 2015 ECHO - 06/05/21 Cardiac Cath - 06/12/21  ICD Pacemaker/Loop - n/a  Sleep Study -  n/a CPAP - none  Aspirin and Blood Thinner Instructions: Follow your surgeon's instructions on when to stop Aspirin and Plavix prior to surgery,  If no instructions were given by your surgeon then you will need to call the office for those instructions.  Last dose plavix 07/07/21. Last dose ASA 07/11/21.  ERAS: Clear liquids til 4:30 AM DOS.  Anesthesia review: Yes  STOP now taking any Aspirin (unless otherwise instructed by your surgeon), Aleve, Naproxen, Ibuprofen, Motrin, Advil, Goody's, BC's, all herbal medications, fish oil, and all vitamins.   Coronavirus Screening Covid test in/a Ambulatory Surgery  Do you have any of the following symptoms:  Cough yes/no: No Fever (>100.84F)  yes/no: No Runny nose yes/no: No Sore throat yes/no: No Difficulty breathing/shortness of breath  yes/no: No  Have you traveled in the last 14 days and where? yes/no: No  Patient verbalized understanding of instructions that were given via phone.

## 2021-07-12 ENCOUNTER — Telehealth: Payer: Self-pay | Admitting: Orthopaedic Surgery

## 2021-07-12 ENCOUNTER — Other Ambulatory Visit (HOSPITAL_BASED_OUTPATIENT_CLINIC_OR_DEPARTMENT_OTHER): Payer: Self-pay | Admitting: Orthopaedic Surgery

## 2021-07-12 ENCOUNTER — Ambulatory Visit (HOSPITAL_COMMUNITY): Payer: BC Managed Care – PPO | Admitting: Physician Assistant

## 2021-07-12 ENCOUNTER — Encounter (HOSPITAL_COMMUNITY): Payer: Self-pay | Admitting: Orthopaedic Surgery

## 2021-07-12 ENCOUNTER — Encounter (HOSPITAL_BASED_OUTPATIENT_CLINIC_OR_DEPARTMENT_OTHER): Payer: Self-pay | Admitting: Orthopaedic Surgery

## 2021-07-12 ENCOUNTER — Encounter (HOSPITAL_COMMUNITY)
Admission: RE | Disposition: A | Discharge status: Home / Self Care | Payer: Self-pay | Source: Home / Self Care | Attending: Orthopaedic Surgery

## 2021-07-12 ENCOUNTER — Ambulatory Visit (HOSPITAL_COMMUNITY)
Admission: RE | Admit: 2021-07-12 | Discharge: 2021-07-12 | Disposition: A | Discharge status: Home / Self Care | Payer: BC Managed Care – PPO | Attending: Orthopaedic Surgery | Admitting: Orthopaedic Surgery

## 2021-07-12 DIAGNOSIS — S83207A Unspecified tear of unspecified meniscus, current injury, left knee, initial encounter: Secondary | ICD-10-CM | POA: Diagnosis not present

## 2021-07-12 DIAGNOSIS — X58XXXA Exposure to other specified factors, initial encounter: Secondary | ICD-10-CM | POA: Insufficient documentation

## 2021-07-12 DIAGNOSIS — I5042 Chronic combined systolic (congestive) and diastolic (congestive) heart failure: Secondary | ICD-10-CM | POA: Insufficient documentation

## 2021-07-12 DIAGNOSIS — Z955 Presence of coronary angioplasty implant and graft: Secondary | ICD-10-CM | POA: Diagnosis not present

## 2021-07-12 DIAGNOSIS — S83272A Complex tear of lateral meniscus, current injury, left knee, initial encounter: Secondary | ICD-10-CM | POA: Insufficient documentation

## 2021-07-12 DIAGNOSIS — E785 Hyperlipidemia, unspecified: Secondary | ICD-10-CM | POA: Insufficient documentation

## 2021-07-12 DIAGNOSIS — I110 Hypertensive heart disease with heart failure: Secondary | ICD-10-CM | POA: Insufficient documentation

## 2021-07-12 DIAGNOSIS — I2510 Atherosclerotic heart disease of native coronary artery without angina pectoris: Secondary | ICD-10-CM | POA: Insufficient documentation

## 2021-07-12 DIAGNOSIS — S83232A Complex tear of medial meniscus, current injury, left knee, initial encounter: Secondary | ICD-10-CM | POA: Insufficient documentation

## 2021-07-12 DIAGNOSIS — S83242A Other tear of medial meniscus, current injury, left knee, initial encounter: Secondary | ICD-10-CM | POA: Diagnosis not present

## 2021-07-12 DIAGNOSIS — I130 Hypertensive heart and chronic kidney disease with heart failure and stage 1 through stage 4 chronic kidney disease, or unspecified chronic kidney disease: Secondary | ICD-10-CM | POA: Diagnosis not present

## 2021-07-12 HISTORY — PX: KNEE ARTHROSCOPY: SHX127

## 2021-07-12 LAB — CBC
HCT: 44.2 % (ref 39.0–52.0)
Hemoglobin: 15.2 g/dL (ref 13.0–17.0)
MCH: 32.3 pg (ref 26.0–34.0)
MCHC: 34.4 g/dL (ref 30.0–36.0)
MCV: 94 fL (ref 80.0–100.0)
Platelets: 265 10*3/uL (ref 150–400)
RBC: 4.7 MIL/uL (ref 4.22–5.81)
RDW: 13.1 % (ref 11.5–15.5)
WBC: 8.5 10*3/uL (ref 4.0–10.5)
nRBC: 0 % (ref 0.0–0.2)

## 2021-07-12 LAB — BASIC METABOLIC PANEL
Anion gap: 8 (ref 5–15)
BUN: 21 mg/dL — ABNORMAL HIGH (ref 6–20)
CO2: 25 mmol/L (ref 22–32)
Calcium: 8.7 mg/dL — ABNORMAL LOW (ref 8.9–10.3)
Chloride: 103 mmol/L (ref 98–111)
Creatinine, Ser: 1.19 mg/dL (ref 0.61–1.24)
GFR, Estimated: >60 mL/min (ref >60)
Glucose, Bld: 114 mg/dL — ABNORMAL HIGH (ref 70–99)
Potassium: 4.6 mmol/L (ref 3.5–5.1)
Sodium: 136 mmol/L (ref 135–145)

## 2021-07-12 SURGERY — ARTHROSCOPY, KNEE
Anesthesia: General | Site: Knee | Laterality: Left

## 2021-07-12 MED ORDER — FENTANYL CITRATE (PF) 250 MCG/5ML IJ SOLN
INTRAMUSCULAR | Status: DC | PRN
  Administered 2021-07-12 (×5): 50 ug via INTRAVENOUS

## 2021-07-12 MED ORDER — BUPIVACAINE HCL (PF) 0.25 % IJ SOLN
INTRAMUSCULAR | Status: AC
  Filled 2021-07-12: qty 30

## 2021-07-12 MED ORDER — OXYCODONE HCL 5 MG PO TABS: 5.0000 mg | ORAL_TABLET | Freq: Once | ORAL | Status: DC | PRN

## 2021-07-12 MED ORDER — FENTANYL CITRATE (PF) 250 MCG/5ML IJ SOLN
INTRAMUSCULAR | Status: AC
  Filled 2021-07-12: qty 5

## 2021-07-12 MED ORDER — GABAPENTIN 300 MG PO CAPS
300.0000 mg | ORAL_CAPSULE | Freq: Once | ORAL | Status: AC
  Administered 2021-07-12: 300 mg via ORAL
  Filled 2021-07-12: qty 1

## 2021-07-12 MED ORDER — ACETAMINOPHEN 500 MG PO TABS: 1000.0000 mg | ORAL_TABLET | Freq: Once | ORAL | Status: DC

## 2021-07-12 MED ORDER — MIDAZOLAM HCL 2 MG/2ML IJ SOLN
INTRAMUSCULAR | Status: AC
  Filled 2021-07-12: qty 2

## 2021-07-12 MED ORDER — TRANEXAMIC ACID-NACL 1000-0.7 MG/100ML-% IV SOLN
1000.0000 mg | INTRAVENOUS | Status: AC
  Administered 2021-07-12: 1000 mg via INTRAVENOUS
  Filled 2021-07-12: qty 100

## 2021-07-12 MED ORDER — 0.9 % SODIUM CHLORIDE (POUR BTL) OPTIME
TOPICAL | Status: DC | PRN
  Administered 2021-07-12: 1000 mL

## 2021-07-12 MED ORDER — DEXAMETHASONE SODIUM PHOSPHATE 10 MG/ML IJ SOLN
INTRAMUSCULAR | Status: DC | PRN
Start: 2021-07-12 — End: 2021-07-12
  Administered 2021-07-12: 10 mg via INTRAVENOUS

## 2021-07-12 MED ORDER — ONDANSETRON HCL 4 MG/2ML IJ SOLN
INTRAMUSCULAR | Status: AC
  Filled 2021-07-12: qty 2

## 2021-07-12 MED ORDER — BUPIVACAINE-EPINEPHRINE (PF) 0.25% -1:200000 IJ SOLN
INTRAMUSCULAR | Status: AC
  Filled 2021-07-12: qty 30

## 2021-07-12 MED ORDER — PROPOFOL 10 MG/ML IV BOLUS
INTRAVENOUS | Status: DC | PRN
  Administered 2021-07-12: 200 mg via INTRAVENOUS

## 2021-07-12 MED ORDER — OXYCODONE HCL 5 MG PO TABS: 5.0000 mg | ORAL_TABLET | ORAL | 0 refills | Status: DC | PRN

## 2021-07-12 MED ORDER — EPINEPHRINE PF 1 MG/ML IJ SOLN
INTRAMUSCULAR | Status: DC | PRN
  Administered 2021-07-12: 2 mg

## 2021-07-12 MED ORDER — PHENYLEPHRINE 40 MCG/ML (10ML) SYRINGE FOR IV PUSH (FOR BLOOD PRESSURE SUPPORT)
PREFILLED_SYRINGE | INTRAVENOUS | Status: DC | PRN
  Administered 2021-07-12: 80 ug via INTRAVENOUS

## 2021-07-12 MED ORDER — LIDOCAINE 2% (20 MG/ML) 5 ML SYRINGE
INTRAMUSCULAR | Status: AC
  Filled 2021-07-12: qty 5

## 2021-07-12 MED ORDER — BUPIVACAINE-EPINEPHRINE 0.25% -1:200000 IJ SOLN
INTRAMUSCULAR | Status: DC | PRN
  Administered 2021-07-12: 9 mL

## 2021-07-12 MED ORDER — CHLORHEXIDINE GLUCONATE 0.12 % MT SOLN
15.0000 mL | OROMUCOSAL | Status: AC
  Administered 2021-07-12: 15 mL via OROMUCOSAL
  Filled 2021-07-12 (×2): qty 15

## 2021-07-12 MED ORDER — ONDANSETRON HCL 4 MG/2ML IJ SOLN
INTRAMUSCULAR | Status: DC | PRN
Start: 2021-07-12 — End: 2021-07-12
  Administered 2021-07-12: 4 mg via INTRAVENOUS

## 2021-07-12 MED ORDER — PROPOFOL 10 MG/ML IV BOLUS
INTRAVENOUS | Status: AC
  Filled 2021-07-12: qty 20

## 2021-07-12 MED ORDER — OXYCODONE HCL 5 MG PO CAPS: 5.0000 mg | ORAL_CAPSULE | ORAL | 0 refills | Status: DC | PRN

## 2021-07-12 MED ORDER — MORPHINE SULFATE (PF) 4 MG/ML IV SOLN
INTRAVENOUS | Status: AC
  Filled 2021-07-12: qty 2

## 2021-07-12 MED ORDER — CLONIDINE HCL (ANALGESIA) 100 MCG/ML EP SOLN
EPIDURAL | Status: AC
  Filled 2021-07-12: qty 10

## 2021-07-12 MED ORDER — DEXAMETHASONE SODIUM PHOSPHATE 10 MG/ML IJ SOLN
INTRAMUSCULAR | Status: AC
  Filled 2021-07-12: qty 1

## 2021-07-12 MED ORDER — DEXMEDETOMIDINE (PRECEDEX) IN NS 20 MCG/5ML (4 MCG/ML) IV SYRINGE
PREFILLED_SYRINGE | INTRAVENOUS | Status: AC
  Filled 2021-07-12: qty 10

## 2021-07-12 MED ORDER — FENTANYL CITRATE (PF) 100 MCG/2ML IJ SOLN: 25.0000 ug | INTRAMUSCULAR | Status: DC | PRN

## 2021-07-12 MED ORDER — OXYCODONE HCL 5 MG/5ML PO SOLN: 5.0000 mg | Freq: Once | ORAL | Status: DC | PRN

## 2021-07-12 MED ORDER — CEFAZOLIN IN SODIUM CHLORIDE 3-0.9 GM/100ML-% IV SOLN
3.0000 g | Freq: Once | INTRAVENOUS | Status: AC
  Administered 2021-07-12: 3 g via INTRAVENOUS
  Filled 2021-07-12: qty 100

## 2021-07-12 MED ORDER — EPINEPHRINE PF 1 MG/ML IJ SOLN
INTRAMUSCULAR | Status: AC
  Filled 2021-07-12: qty 2

## 2021-07-12 MED ORDER — PHENYLEPHRINE 40 MCG/ML (10ML) SYRINGE FOR IV PUSH (FOR BLOOD PRESSURE SUPPORT)
PREFILLED_SYRINGE | INTRAVENOUS | Status: AC
  Filled 2021-07-12: qty 10

## 2021-07-12 MED ORDER — MIDAZOLAM HCL 2 MG/2ML IJ SOLN
INTRAMUSCULAR | Status: DC | PRN
Start: 2021-07-12 — End: 2021-07-12
  Administered 2021-07-12: 2 mg via INTRAVENOUS

## 2021-07-12 MED ORDER — LACTATED RINGERS IV SOLN: INTRAVENOUS | Status: DC

## 2021-07-12 MED ORDER — AMISULPRIDE (ANTIEMETIC) 5 MG/2ML IV SOLN: 10.0000 mg | Freq: Once | INTRAVENOUS | Status: DC | PRN

## 2021-07-12 MED ORDER — ONDANSETRON HCL 4 MG/2ML IJ SOLN: 4.0000 mg | Freq: Once | INTRAMUSCULAR | Status: DC | PRN

## 2021-07-12 MED ORDER — LIDOCAINE 2% (20 MG/ML) 5 ML SYRINGE
INTRAMUSCULAR | Status: DC | PRN
  Administered 2021-07-12: 100 mg via INTRAVENOUS

## 2021-07-12 MED ORDER — ACETAMINOPHEN 500 MG PO TABS
1000.0000 mg | ORAL_TABLET | Freq: Once | ORAL | Status: AC
  Administered 2021-07-12: 1000 mg via ORAL
  Filled 2021-07-12: qty 2

## 2021-07-12 MED ORDER — ACETAMINOPHEN 500 MG PO TABS: 500.0000 mg | ORAL_TABLET | Freq: Three times a day (TID) | ORAL | 0 refills | Status: AC

## 2021-07-12 MED ORDER — SODIUM CHLORIDE 0.9 % IR SOLN
Status: DC | PRN
  Administered 2021-07-12 (×2): 6000 mL

## 2021-07-12 SURGICAL SUPPLY — 52 items
BAG COUNTER SPONGE SURGICOUNT (BAG) ×2 IMPLANT
BANDAGE ESMARK 6X9 LF (GAUZE/BANDAGES/DRESSINGS) IMPLANT
BLADE CLIPPER SURG (BLADE) IMPLANT
BLADE EXCALIBUR 4.0X13 (MISCELLANEOUS) ×2 IMPLANT
BNDG ELASTIC 6X10 VLCR STRL LF (GAUZE/BANDAGES/DRESSINGS) ×1 IMPLANT
BNDG ESMARK 6X9 LF (GAUZE/BANDAGES/DRESSINGS)
CHLORAPREP W/TINT 26 (MISCELLANEOUS) ×2 IMPLANT
COOLER ICEMAN CLASSIC (MISCELLANEOUS) ×2 IMPLANT
COVER SURGICAL LIGHT HANDLE (MISCELLANEOUS) ×2 IMPLANT
CUFF TOURN SGL QUICK 34 (TOURNIQUET CUFF)
CUFF TOURN SGL QUICK 42 (TOURNIQUET CUFF) IMPLANT
CUFF TRNQT CYL 34X4.125X (TOURNIQUET CUFF) IMPLANT
DISSECTOR  3.8MM X 13CM (MISCELLANEOUS) ×1
DISSECTOR 3.8MM X 13CM (MISCELLANEOUS) IMPLANT
DRAPE ARTHROSCOPY W/POUCH 114 (DRAPES) ×2 IMPLANT
DRAPE U-SHAPE 47X51 STRL (DRAPES) ×2 IMPLANT
DRSG TEGADERM 4X4.75 (GAUZE/BANDAGES/DRESSINGS) ×3 IMPLANT
DW OUTFLOW CASSETTE/TUBE SET (MISCELLANEOUS) ×2 IMPLANT
EXCALIBUR 3.8MM X 13CM (MISCELLANEOUS) ×1 IMPLANT
GAUZE SPONGE 4X4 12PLY STRL (GAUZE/BANDAGES/DRESSINGS) ×1 IMPLANT
GAUZE XEROFORM 1X8 LF (GAUZE/BANDAGES/DRESSINGS) ×1 IMPLANT
GLOVE SRG 8 PF TXTR STRL LF DI (GLOVE) ×1 IMPLANT
GLOVE SURG ENC MOIS LTX SZ6 (GLOVE) ×4 IMPLANT
GLOVE SURG LTX SZ8 (GLOVE) ×4 IMPLANT
GLOVE SURG UNDER POLY LF SZ6.5 (GLOVE) ×2 IMPLANT
GLOVE SURG UNDER POLY LF SZ8 (GLOVE) ×1
GOWN STRL REUS W/ TWL LRG LVL3 (GOWN DISPOSABLE) ×2 IMPLANT
GOWN STRL REUS W/ TWL XL LVL3 (GOWN DISPOSABLE) ×1 IMPLANT
GOWN STRL REUS W/TWL LRG LVL3 (GOWN DISPOSABLE) ×2
GOWN STRL REUS W/TWL XL LVL3 (GOWN DISPOSABLE) ×1
KIT BASIN OR (CUSTOM PROCEDURE TRAY) ×2 IMPLANT
KIT TURNOVER KIT B (KITS) ×2 IMPLANT
MANIFOLD NEPTUNE II (INSTRUMENTS) IMPLANT
NDL 18GX1X1/2 (RX/OR ONLY) (NEEDLE) IMPLANT
NDL HYPO 25GX1X1/2 BEV (NEEDLE) ×1 IMPLANT
NEEDLE 18GX1X1/2 (RX/OR ONLY) (NEEDLE) IMPLANT
NEEDLE HYPO 25GX1X1/2 BEV (NEEDLE) ×2 IMPLANT
NS IRRIG 1000ML POUR BTL (IV SOLUTION) IMPLANT
PACK ARTHROSCOPY DSU (CUSTOM PROCEDURE TRAY) ×2 IMPLANT
PAD ARMBOARD 7.5X6 YLW CONV (MISCELLANEOUS) ×4 IMPLANT
PADDING CAST COTTON 6X4 STRL (CAST SUPPLIES) ×2 IMPLANT
PORT APPOLLO RF 90DEGREE MULTI (SURGICAL WAND) ×1 IMPLANT
SPONGE T-LAP 4X18 ~~LOC~~+RFID (SPONGE) ×2 IMPLANT
SUT ETHILON 3 0 PS 1 (SUTURE) ×2 IMPLANT
SYR 20ML ECCENTRIC (SYRINGE) ×2 IMPLANT
SYR CONTROL 10ML LL (SYRINGE) IMPLANT
SYR TB 1ML LUER SLIP (SYRINGE) ×2 IMPLANT
TOWEL GREEN STERILE (TOWEL DISPOSABLE) ×2 IMPLANT
TOWEL GREEN STERILE FF (TOWEL DISPOSABLE) ×2 IMPLANT
TUBE CONNECTING 12X1/4 (SUCTIONS) ×2 IMPLANT
TUBING ARTHROSCOPY IRRIG 16FT (MISCELLANEOUS) ×2 IMPLANT
WATER STERILE IRR 1000ML POUR (IV SOLUTION) ×2 IMPLANT

## 2021-07-12 NOTE — Anesthesia Procedure Notes (Signed)
Procedure Name: LMA Insertion Date/Time: 07/12/2021 7:33 AM Performed by: Quentin Ore, CRNA Pre-anesthesia Checklist: Patient identified, Emergency Drugs available, Suction available and Patient being monitored Patient Re-evaluated:Patient Re-evaluated prior to induction Oxygen Delivery Method: Circle system utilized Preoxygenation: Pre-oxygenation with 100% oxygen Induction Type: IV induction LMA: LMA inserted LMA Size: 4.0 Number of attempts: 1 Placement Confirmation: positive ETCO2 and breath sounds checked- equal and bilateral Tube secured with: Tape Dental Injury: Teeth and Oropharynx as per pre-operative assessment

## 2021-07-12 NOTE — Telephone Encounter (Signed)
Spoke to patient directly and informed pt Dr. Steward Drone sent Rx to [facility] in [place] [address].

## 2021-07-12 NOTE — Discharge Instructions (Signed)
° ° °   Discharge Instructions    Attending Surgeon: Huel Cote, MD Office Phone Number: [phone]   Diagnosis and Procedures:    Surgeries Performed: Left knee arthroscopy with medial lateral meniscectomy  Discharge Plan:    Diet: Resume usual diet. Begin with light or bland foods.  Drink plenty of fluids.  Activity:  Weightbearing as tolerated You are advised to go home directly from the hospital or surgical center. Restrict your activities.  GENERAL INSTRUCTIONS: 1.  Keep your surgical site elevated above your heart for at least 5-7 days or longer to prevent swelling. This will improve your comfort and your overall recovery following surgery.     2. Please call Dr. Serena Croissant office at [phone] with questions Monday-Friday during business hours. If no one answers, please leave a message and someone should get back to the patient within 24 hours. For emergencies please call 911 or proceed to the emergency room.   3. Patient to notify surgical team if experiences any of the following: Bowel/Bladder dysfunction, uncontrolled pain, nerve/muscle weakness, incision with increased drainage or redness, nausea/vomiting and Fever greater than 101.0 F.  Be alert for signs of infection including redness, streaking, odor, fever or chills. Be alert for excessive pain or bleeding and notify your surgeon immediately.  WOUND INSTRUCTIONS:   Leave your dressing/cast/splint in place until your post operative visit.  Keep it clean and dry.  Always keep the incision clean and dry until the staples/sutures are removed. If there is no drainage from the incision you should keep it open to air. If there is drainage from the incision you must keep it covered at all times until the drainage stops  Do not soak in a bath tub, hot tub, pool, lake or other body of water until 21 days after your surgery and your incision is completely dry and healed.  If you have removable sutures (or staples) they  must be removed 10-14 days (unless otherwise instructed) from the day of your surgery.     1)  Elevate the extremity as much as possible.  2)  Keep the dressing clean and dry.  3)  Please call us if the dressing becomes wet or dirty.  4)  If you are experiencing worsening pain or worsening swelling, please call.     MEDICATIONS: Resume all previous home medications at the previous prescribed dose and frequency unless otherwise noted Start taking the  pain medications on an as-needed basis as prescribed  Please taper down pain medication over the next week following surgery.  Ideally you should not require a refill of any narcotic pain medication.  Take pain medication with food to minimize nausea. In addition to the prescribed pain medication, you may take over-the-counter pain relievers such as Tylenol.  Do NOT take additional tylenol if your pain medication already has tylenol in it.  Continue Aspirin and plavix on postop day 1      FOLLOWUP INSTRUCTIONS: 1. Follow up at the Physical Therapy Clinic 3-4 days following surgery. This appointment should be scheduled unless other arrangements have been made.The Physical Therapy scheduling number is [phone] if an appointment has not already been arranged.  2. Contact Dr. Serena Croissant office during office hours at [phone] or the practice after hours line at [phone] for non-emergencies. For medical emergencies call 911.   Discharge Location: Home

## 2021-07-12 NOTE — H&P (Signed)
Chief Complaint: left knee pain        History of Present Illness:      Blake Gomez is a 61 y.o. male presents with ongoing left knee pain for 6 to 8 months.  He currently works in plumbing as well as flooring and has significant pain about the left knee particularly with significant activity like working in crawl spaces and under buildings.  He has had 2 previous injections the first 1 which helped for several months and the second 1 which did not help.  He has been taking Advil which does not significantly help with the pain.  He states that he has pain with direct activity.  He occasionally feels some crepitus or popping.  He did not have any specific injury or trauma.  He is currently on aspirin as well as Plavix for cardiac history.       Surgical History:   None   PMH/PSH/Family History/Social History/Meds/Allergies:         Past Medical History:  Diagnosis Date   Bronchitis, mucopurulent recurrent ([place])     CAD (coronary artery disease)      stent 2014.  No MI.  Echo normal.   Chronic renal insufficiency, stage 2 (mild)     GERD (gastroesophageal reflux disease)     Hay fever     Hematochezia 2017; 2019    Hematochezia/screening: 2017 Colonoscopy normal except adenomatous polyp.  2019 colonoscopy, +polyp, recall 2022.   History of adenomatous polyp of colon 2017; 2019    Recall 2022   History of hematuria      per old records   Hypercholesterolemia     Hypertension     Left knee pain 2021    MRI 10/2019-> moderate DJD medial mostly, +deg tear of medial and lateral menisci, +effusion, +popliteal cyst->recommended ortho   Obesity, Class II, BMI 35-39.9     Unilateral primary osteoarthritis, left knee      + deg menisc tears: Dr. Marlou Sa, steroid inj 11/2019, pt wants to avoid surgery         Past Surgical History:  Procedure Laterality Date   CARDIAC CATHETERIZATION   09/2008    95% LAD lesion-->DES   CARDIOVASCULAR STRESS TEST   2015     cardiolyte->no ischemia.  EF 60%.   COLONOSCOPY   2017/2019    Hematochezia/screening: 2017 Colonoscopy normal except adenomatous polyp.  2019 colonoscopy, +polyp, recall 2022.   CORONARY ANGIOPLASTY WITH STENT PLACEMENT   2010    DES to LAD    Social History         Socioeconomic History   Marital status: Married      Spouse name: Not on file   Number of children: Not on file   Years of education: Not on file   Highest education level: Not on file  Occupational History   Not on file  Tobacco Use   Smoking status: Never   Smokeless tobacco: Never  Vaping Use   Vaping Use: Never used  Substance and Sexual Activity   Alcohol use: Never   Drug use: Never   Sexual activity: Not on file  Other Topics Concern   Not on file  Social History Narrative    Married, wife [name].  No children.    Relocated from [facility] b/c lost job due to covid.    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    Lives on [address].    No  T/A/Ds.    Social Determinants of Health    Financial Resource Strain: Not on file  Food Insecurity: Not on file  Transportation Needs: Not on file  Physical Activity: Not on file  Stress: Not on file  Social Connections: Not on file         Family History  Problem Relation Age of Onset   Heart disease Mother     Diverticulitis Mother     Cancer Father     Early death Brother          car accident   Alzheimer's disease Maternal Grandmother      No Known Allergies       Current Outpatient Medications  Medication Sig Dispense Refill   aspirin 81 MG EC tablet Take 1 tablet (81 mg total) by mouth daily. Swallow whole. 90 tablet 3   atorvastatin (LIPITOR) 80 MG tablet Take once a day for cholesterol 90 tablet 3   benzonatate (TESSALON) 100 MG capsule Take 1 capsule (100 mg total) by mouth 3 (three) times daily as needed for cough. 40 capsule 3   clopidogrel (PLAVIX) 75 MG tablet Take one tablet a day for artery disease 90 tablet 3    clotrimazole-betamethasone (LOTRISONE) cream Apply 1 application topically daily. 45 g 3   diclofenac Sodium (VOLTAREN) 1 % GEL Apply 4 g topically 4 (four) times daily. 100 g 2   Fish Oil-Krill Oil (MEGARED ADVANCED 4 IN 1 PO) Take by mouth daily.       folic acid (FOLVITE) Q000111Q MCG tablet folic acid Q000111Q mcg tablet  Take 1 tablet every day by oral route.       ketoconazole (NIZORAL) 2 % shampoo Apply 1 application topically 2 (two) times a week. 120 mL 0   metoprolol succinate (TOPROL-XL) 25 MG 24 hr tablet Take one tablet once a day for blood pressure and heart rate 90 tablet 3   Multiple Vitamins-Minerals (CENTRUM SILVER PO) Centrum Silver Men 300 mcg-600 mcg-300 mcg tablet  Take 1 tablet every day by oral route.       nitroGLYCERIN (NITROSTAT) 0.4 MG SL tablet Place 1 tablet (0.4 mg total) under the tongue every 5 (five) minutes as needed for chest pain. 25 tablet 3   predniSONE (DELTASONE) 20 MG tablet Take 2 tablets (40 mg total) by mouth daily with breakfast. For 5 days- for bronchitis 10 tablet 0    No current facility-administered medications for this visit.    Imaging Results (Last 48 hours)  No results found.     Review of Systems:   A ROS was performed including pertinent positives and negatives as documented in the HPI.   Physical Exam :   Constitutional: NAD and appears stated age Neurological: Alert and oriented Psych: Appropriate affect and cooperative There were no vitals taken for this visit.    Comprehensive Musculoskeletal Exam:         Musculoskeletal Exam  Gait Normal  Alignment Normal    Right Left  Inspection Normal Normal  Palpation      Tenderness none Medial joint  Crepitus None None  Effusion none None  Range of Motion      Extension 0 0  Flexion 135 135 with pain  Strength      Extension 5/5 5/5  Flexion 5/5 5/5  Ligament Exam        Generalized Laxity No No  Lachman Negative Negative   Pivot Shift Negative Negative  Anterior Drawer  Negative Negative  Valgus  at 0 Negative Negative  Valgus at 20 Negative Negative  Varus at 0 0 0  Varus at 20   0 0  Posterior Drawer at 90 0 0  Vascular/Lymphatic Exam      Edema None None  Venous Stasis Changes No No  Distal Circulation Normal Normal  Neurologic      Light Touch Sensation Intact Intact  Special Tests: Positive McMurray left knee medial joint        Imaging:   Xray (4 views left knee): Essentially normal with mild medial compartment narrowing   MRI (left knee): There is a significant posterior medial meniscal tear with extension to the posterior horn.  The tear appears to be flipped into the notch.   I personally reviewed and interpreted the radiographs.     Assessment:   61 year old male with a left medial meniscal tear complex which appears to be flipped into the notch.  Overall I have advised that these typically can cause significant pain with activity as they often displaced into the notch and cause irritation.  He has no longer getting any type of pain relief from injections.  As result I would like to plan to perform a left knee arthroscopy with medial meniscal debridement.   Plan :     -Plan for left knee arthroscopy with medial meniscal debridement     After a lengthy discussion of treatment options, including risks, benefits, alternatives, complications of surgical and nonsurgical conservative options, the patient elected surgical repair.    The patient  is aware of the material risks  and complications including, but not limited to injury to adjacent structures, neurovascular injury, infection, numbness, bleeding, implant failure, thermal burns, stiffness, persistent pain, failure to heal, disease transmission from allograft, need for further surgery, dislocation, anesthetic risks, blood clots, risks of death,and others. The probabilities of surgical success and failure discussed with patient given their particular co-morbidities.The time and nature of  expected rehabilitation and recovery was discussed.The patient's questions were all answered preoperatively.  No barriers to understanding were noted. I explained the natural history of the disease process and Rx rationale.  I explained to the patient what I considered to be reasonable expectations given their personal situation.  The final treatment plan was arrived at through a shared patient decision making process model.

## 2021-07-12 NOTE — Anesthesia Postprocedure Evaluation (Signed)
Anesthesia Post Note  Patient: Pinchus Weckwerth  Procedure(s) Performed: Left ARTHROSCOPY KNEE / with debridement (Left: Knee)     Patient location during evaluation: PACU Anesthesia Type: General Level of consciousness: awake and alert Pain management: pain level controlled Vital Signs Assessment: post-procedure vital signs reviewed and stable Respiratory status: spontaneous breathing, nonlabored ventilation and respiratory function stable Cardiovascular status: blood pressure returned to baseline and stable Postop Assessment: no apparent nausea or vomiting Anesthetic complications: no   No notable events documented.  Last Vitals:  Vitals:   07/12/21 0908 07/12/21 0921  BP: 120/76 128/89  Pulse: (!) 57   Resp: 18 17  Temp:  36.9 C  SpO2: 98%     Last Pain:  Vitals:   07/12/21 0921  TempSrc:   PainSc: 0-No pain                 Lucretia Kern

## 2021-07-12 NOTE — Brief Op Note (Signed)
° °  Brief Op Note  Date of Surgery: 07/12/2021  Preoperative Diagnosis: Left knee medial meniscus tear  Postoperative Diagnosis: same  Procedure: Procedure(s): Left ARTHROSCOPY KNEE / with debridement  Implants: * No implants in log *  Surgeons: Surgeon(s): Huel Cote, MD  Anesthesia: Regional    Estimated Blood Loss: See anesthesia record  Complications: None  Condition to PACU: Stable  Benancio Deeds, MD 07/12/2021 8:40 AM

## 2021-07-12 NOTE — Telephone Encounter (Signed)
Pt called stating he had surgery and the dr was to sent refill of oxycodone into pharmacy on file. Please call pt when medication has been sent in. Pt has no pain medication. Pt phone number is [phone].

## 2021-07-12 NOTE — Op Note (Addendum)
Date of Surgery: 07/12/2021  INDICATIONS: Mr. Friedley is a 61 y.o.-year-old male with medial and lateral meniscal tearing failing conservative management.  The risk and benefits of the procedure with discussed in detail and documented in the pre-operative evaluation.  PREOPERATIVE DIAGNOSIS: 1.  Left knee Medial and lateral meniscal tear complex  POSTOPERATIVE DIAGNOSIS: Same.  PROCEDURE: 1.  Left knee medial and lateral meniscectomy  SURGEON: Yevonne Pax MD  ASSISTANT: Raynelle Fanning, ATC; necessary for the timely completion of procedure and due to complexity of procedure.  ANESTHESIA:  general plus local  IV FLUIDS AND URINE: See anesthesia record.  ANTIBIOTICS: Ancef 2 g  ESTIMATED BLOOD LOSS: 10 mL.  IMPLANTS:  * No implants in log *  DRAINS: None  CULTURES: None  COMPLICATIONS: none  DESCRIPTION OF PROCEDURE:  Examination under anesthesia: A careful examination under anesthesia was performed.  Knee ROM motion was: 0-135 Lachman: Normal Pivot Shift: Normal Posterior drawer: normal.   Varus stability in full extension: normal.   Varus stability in 30 degrees of flexion: normal.  Valgus stability in full extension: normal.   Valgus stability in 30 degrees of flexion: normal.  Posterolateral drawer: normal   Intra-operative findings: A thorough arthroscopic examination of the knee was performed.  The findings are: 1. Suprapatellar pouch: Normal 2. Undersurface of median ridge: Grade 2 changes 3. Medial patellar facet: grade 1 changes 4. Lateral patellar facet: Grade 2 changes 5. Trochlea: Grade 2 changes 6. Lateral gutter/popliteus tendon: Normal 7. Hoffa's fat pad: Normal 8. Medial gutter/plica: Normal 9. ACL: Normal 10. PCL: Normal 11. Medial meniscus: /Complex tear involving the body and posterior third, intact root 12. Medial compartment cartilage: Grade 2 changes 13. Lateral meniscus: Complex radial posterior body tear with intact root 14. Lateral  compartment cartilage: grade 4 focal changes involving posterior plateau near the root  The patient was identified in the preoperative holding area.  The correct site was marked according universal protocol with nursing.  Is subsequently taken back to the operating room.  Anesthesia was induced.  Ancef was given 1 hour prior to skin incision.  He was prepped and draped in usual sterile fashion.  Again final timeout was performed confirming correct site.  All bony prominences were padded.  Standard diagnostic knee arthroscopy ensued with a standard anterior lateral portal.  Portal sites were injected with quarter percent Marcaine with epinephrine prior to incision.  An anterior proximal medial outflow portal was established under direct visualization.  The medial portal was then established with inside-out fashion and the medial meniscus was visualized.  This was a complex tear involving the mid body to the posterior root with the superior leaflet being torn and flipped up into the notch.  This was debrided using a 3.8 mm shaver.  There is approximately 4 mm of remaining rim left.  The root was probed and found to be intact  Attention was then turned to the lateral meniscus.  There is a complex radial tear of the superior leaflet in the posterior third of the meniscus with an intact root.  This was debrided again with a 3.8 mm shaver back to healthy inferior leaflet.  The remaining root was found to be stable.  The arthroscopy fluid was evacuated from the knee portals were closed with 3-0 nylon.  Soft dressing was applied with Xeroform, gauze, web roll and an Ace wrap   Raynelle Fanning ATC was necessary for opening, closing, retracting, limb positioning and overall facilitation and timely completion of the  procedure.     POSTOPERATIVE PLAN: The patient will be activity as tolerated.  He will be given crutches for assistance with ambulation for several days following surgery.  I would like him to advance  his range of motion and weightbearing as tolerated.  I will see her back in 2 weeks for suture removal.  We will begin physical therapy.  Yevonne Pax, MD 8:40 AM

## 2021-07-12 NOTE — Transfer of Care (Signed)
Immediate Anesthesia Transfer of Care Note  Patient: Blake Gomez  Procedure(s) Performed: Left ARTHROSCOPY KNEE / with debridement (Left: Knee)  Patient Location: PACU  Anesthesia Type:General  Level of Consciousness: awake, alert  and oriented  Airway & Oxygen Therapy: Patient Spontanous Breathing  Post-op Assessment: Report given to RN, Post -op Vital signs reviewed and stable and Patient moving all extremities X 4  Post vital signs: Reviewed and stable  Last Vitals:  Vitals Value Taken Time  BP 126/80 07/12/21 0853  Temp 36.2 C 07/12/21 0853  Pulse 70 07/12/21 0854  Resp 25 07/12/21 0854  SpO2 96 % 07/12/21 0854  Vitals shown include unvalidated device data.  Last Pain:  Vitals:   07/12/21 0630  TempSrc:   PainSc: 0-No pain      Patients Stated Pain Goal: 0 (07/12/21 0630)  Complications: No notable events documented.

## 2021-07-12 NOTE — Progress Notes (Signed)
Orthopedic Tech Progress Note Patient Details:  Blake Gomez 06/25/1960 [id number]  Ortho Devices Type of Ortho Device: Crutches Ortho Device/Splint Interventions: Ordered   Post Interventions Instructions Provided: Care of device, Poper ambulation with device, Adjustment of device  [place] A Gerilyn Pilgrim 07/12/2021, 9:21 AM

## 2021-07-13 ENCOUNTER — Encounter (HOSPITAL_COMMUNITY): Payer: Self-pay | Admitting: Orthopaedic Surgery

## 2021-07-16 ENCOUNTER — Ambulatory Visit (HOSPITAL_BASED_OUTPATIENT_CLINIC_OR_DEPARTMENT_OTHER): Payer: BC Managed Care – PPO | Attending: Nurse Practitioner | Admitting: Physical Therapy

## 2021-07-16 ENCOUNTER — Other Ambulatory Visit: Payer: Self-pay

## 2021-07-16 ENCOUNTER — Encounter (HOSPITAL_BASED_OUTPATIENT_CLINIC_OR_DEPARTMENT_OTHER): Payer: Self-pay | Admitting: Physical Therapy

## 2021-07-16 DIAGNOSIS — Z4789 Encounter for other orthopedic aftercare: Secondary | ICD-10-CM | POA: Insufficient documentation

## 2021-07-16 DIAGNOSIS — M25562 Pain in left knee: Secondary | ICD-10-CM | POA: Diagnosis not present

## 2021-07-16 DIAGNOSIS — M25662 Stiffness of left knee, not elsewhere classified: Secondary | ICD-10-CM | POA: Insufficient documentation

## 2021-07-16 NOTE — Therapy (Signed)
OUTPATIENT PHYSICAL THERAPY LOWER EXTREMITY EVALUATION   Patient Name: Blake Gomez MRN: [id number] DOB:Oct 23, 1960, 61 y.o., male Today's Date: 07/17/2021   PT End of Session - 07/17/21 1256     Visit Number 1    Number of Visits 12    Date for PT Re-Evaluation 08/28/21    Authorization Type BCBS PPO    PT Start Time 0800    PT Stop Time 0843    PT Time Calculation (min) 43 min    Activity Tolerance Patient tolerated treatment well    Behavior During Therapy [facility] for tasks assessed/performed             Past Medical History:  Diagnosis Date   Bronchitis, mucopurulent recurrent ([place]) 04/2021   CAD (coronary artery disease)    stent 2014.  No MI.  Echo normal.   Chronic renal insufficiency, stage 2 (mild)    GERD (gastroesophageal reflux disease)    Hay fever    Hematochezia 2017; 2019   Hematochezia/screening: 2017 Colonoscopy normal except adenomatous polyp.  2019 colonoscopy, +polyp, recall 2022.   History of adenomatous polyp of colon 2017; 2019   Recall 2022   History of hematuria    per old records   Hypercholesterolemia    Hypertension    Left knee pain 2021   MRI 10/2019-> moderate DJD medial mostly, +deg tear of medial and lateral menisci, +effusion, +popliteal cyst->recommended ortho   Obesity, Class II, BMI 35-39.9    Unilateral primary osteoarthritis, left knee    + deg menisc tears: Dr. Marlou Sa, steroid inj 11/2019, pt wants to avoid surgery   Past Surgical History:  Procedure Laterality Date   CARDIAC CATHETERIZATION  09/2008   95% LAD lesion-->DES   CARDIOVASCULAR STRESS TEST  2015   cardiolyte->no ischemia.  EF 60%.   COLONOSCOPY  2017/2019   Hematochezia/screening: 2017 Colonoscopy normal except adenomatous polyp.  2019 colonoscopy, +polyp, recall 2022.   CORONARY ANGIOPLASTY WITH STENT PLACEMENT  2010   DES to LAD   KNEE ARTHROSCOPY Left 07/12/2021   Procedure: Left ARTHROSCOPY KNEE / with debridement;  Surgeon: Vanetta Mulders, MD;  Location: [place];   Service: Orthopedics;  Laterality: Left;   RIGHT/LEFT HEART CATH AND CORONARY ANGIOGRAPHY N/A 06/12/2021   Procedure: RIGHT/LEFT HEART CATH AND CORONARY ANGIOGRAPHY;  Surgeon: Belva Crome, MD;  Location: [place] [facility];  Service: Cardiovascular;  Laterality: N/A;   Patient Active Problem List   Diagnosis Date Noted   Acute meniscal tear of left knee    Chronic systolic heart failure ([place])    Hypertension 05/03/2021   Skin rash 05/03/2021   History of colon polyps 05/03/2021   Chronic pain of left knee 05/03/2021   Bronchitis 05/03/2021   Hemorrhoids 03/21/2016   Hyperlipidemia 10/15/2008   Acute renal failure syndrome ([place]) 10/13/2008   Acute coronary syndrome ([place]) 10/13/2008   Precordial pain 10/13/2008    PCP: Orma Render, NP  REFERRING PROVIDER: Dr Donivan Scull   REFERRING DIAG: Medial and Lateral Meniscus Tear    THERAPY DIAG:  Acute pain of left knee  Stiffness of left knee, not elsewhere classified  Encounter for prosthetic gait training  ONSET DATE: Surgery on 07/12/2021   SUBJECTIVE:   SUBJECTIVE STATEMENT: Patient works as a Development worker, community and in Careers adviser. He had a long history of knee pain   PERTINENT HISTORY: CAD, Left Knee OA   PAIN:  Are you having pain? Yes NPRS scale: 5/10 Pain location: left knee  Pain orientation: Right, Medial,  and Lateral  PAIN TYPE: aching Pain description: intermittent  Aggravating factors: Twisting  Relieving factors: Ice  PRECAUTIONS: None  WEIGHT BEARING RESTRICTIONS Yes WBAT   FALLS:  Has patient fallen in last 6 months? No, Number of falls:   LIVING ENVIRONMENT: No steps of stairs int he house  OCCUPATION: Plumber   Recreation: going to the gym   PLOF: Independent  PATIENT GOALS:  To get back into the gym and back to work  OBJECTIVE:   DIAGNOSTIC FINDINGS: Nothing post-op   PATIENT SURVEYS:  FOTO:   COGNITION:  Overall cognitive status: Within functional limits for tasks  assessed     SENSATION:  Light touch: Appears intact  Stereognosis: Appears intact  Hot/Cold: Appears intact  Proprioception: Appears intact  Bilateral numbness in both feet over the past 6 months   MUSCLE LENGTH: POSTURE:  WFL   PALPATION: No unexpected tenderness to palpation   LE AROM/PROM:  MMT  Right 07/17/2021 Left 07/17/2021  Hip flexion 49.6 37.7  Hip extension    Hip abduction 40.8 39.8  Hip adduction    Hip internal rotation    Hip external rotation    Knee flexion    Knee extension 53.1  47.7  Ankle dorsiflexion    Ankle plantarflexion    Ankle inversion    Ankle eversion     (Blank rows = not tested)  LE MMT:  PROM  Right 07/17/2021 Left 07/17/2021  Hip flexion    Hip extension    Hip abduction    Hip adduction    Hip internal rotation    Hip external rotation    Knee flexion WNL 110 w/ minor pain   Knee extension WNL -4  Ankle dorsiflexion    Ankle plantarflexion    Ankle inversion    Ankle eversion     (Blank rows = not tested)   FUNCTIONAL TESTS:  Decreased weight bearing ont he left side with sit to stand transfer  GAIT: Decreased weight bearing on the left Using a cane for ambulation    TODAY'S TREATMENT: Access Code: [email or web address] URL: [email or web address] Date: 07/16/2021 Prepared by: Carolyne Littles  Exercises Supine Knee Extension Stretch on Towel Roll - 3 x daily - 7 x weekly - 2 sets - 5 reps - 10 sec hold Seated Hamstring Stretch - 1 x daily - 7 x weekly - 3 sets - 10 reps Supine Heel Slide - 2 x daily - 7 x weekly - 1 sets - 3 reps - 10 hold Supine Quad Set - 1 x daily - 7 x weekly - 3 sets - 10 reps Supine Straight Leg Raises - 1 x daily - 7 x weekly - 3 sets - 10 reps    PATIENT EDUCATION:  Education details: HEP, symptom management; activity progression  Person educated: Patient Education method: Explanation, Demonstration, Tactile cues, Verbal cues, and Handouts Education comprehension: verbalized  understanding, returned demonstration, verbal cues required, and tactile cues required   HOME EXERCISE PROGRAM: Access Code: [email or web address] URL: [email or web address] Date: 07/16/2021 Prepared by: Carolyne Littles  Exercises Supine Knee Extension Stretch on Towel Roll - 3 x daily - 7 x weekly - 2 sets - 5 reps - 10 sec hold Seated Hamstring Stretch - 1 x daily - 7 x weekly - 3 sets - 10 reps Supine Heel Slide - 2 x daily - 7 x weekly - 1 sets - 3 reps - 10 hold Supine Quad Set - 1 x daily - 7 x weekly - 3 sets -  10 reps Supine Straight Leg Raises - 1 x daily - 7 x weekly - 3 sets - 10 reps   ASSESSMENT:  CLINICAL IMPRESSION: Patient is a 61 y.o. male  who was seen today for physical therapy evaluation and treatment for a medial and lateral meniscal debridement. He presents with expected limitations in motion and strength. He is using a cane for ambulation. His knee range is excellent for 5 days out, but he will need to be able toget on his hands and knees for his job. He would benefit from skilled therapy to reduce pain and improve his ability to perform work tasks.   Objective impairments include Abnormal gait, decreased activity tolerance, decreased endurance, decreased mobility, decreased ROM, decreased strength, increased edema, impaired sensation, and pain. These impairments are limiting patient from cleaning, driving, meal prep, occupation, and shopping. Personal factors including Profession and 1 comorbidity: CAD   are also affecting patient's functional outcome. Patient will benefit from skilled PT to address above impairments and improve overall function.  REHAB POTENTIAL: Excellent  CLINICAL DECISION MAKING: Stable/uncomplicated  EVALUATION COMPLEXITY: Low   GOALS: Goals reviewed with patient? Yes  SHORT TERM GOALS:  STG Name Target Date Goal status  1 Patient will demonstrate full extension  Baseline:  08/07/2021 INITIAL  2 Patient will demonstrate 125 degrees  of knee flexion without pain  Baseline:  08/07/2021 INITIAL  3 Patient will increase gross left LE strength by 5 lbs  Baseline: 08/07/2021 INITIAL  LONG TERM GOALS:   LTG Name Target Date Goal status  1 Patient will be able to get on his hands and knees without pain in order to progress to work.  Baseline: 08/28/2021 INITIAL  2 Patient will stand for 45 minutes without increased pain in order to perform work tasks.  Baseline: 08/28/2021 INITIAL  3 Patient will demonstrate an 88% ability on FOTO in order to demonstrate improved self perceived function   Baseline: 08/28/2021 INITIAL  PLAN: PT FREQUENCY: 2x/week  PT DURATION: 8 weeks  PLANNED INTERVENTIONS: Therapeutic exercises, Therapeutic activity, Neuro Muscular re-education, Balance training, Gait training, Patient/Family education, Joint mobilization, Stair training, DME instructions, Aquatic Therapy, Dry Needling, Electrical stimulation, Cryotherapy, Moist heat, Taping, and Manual therapy  PLAN FOR NEXT SESSION:  Review reaction to HEP; progress to standing exercises; Manula therapy depending on range; add in TKE and SAQ; Standing weight shift; Standing slow march     Carney Living PT DPT  07/17/2021, 1:21 PM

## 2021-07-17 ENCOUNTER — Encounter (HOSPITAL_BASED_OUTPATIENT_CLINIC_OR_DEPARTMENT_OTHER): Payer: Self-pay | Admitting: Physical Therapy

## 2021-07-18 ENCOUNTER — Encounter (HOSPITAL_BASED_OUTPATIENT_CLINIC_OR_DEPARTMENT_OTHER): Payer: Self-pay | Admitting: Physical Therapy

## 2021-07-18 ENCOUNTER — Other Ambulatory Visit: Payer: Self-pay

## 2021-07-18 ENCOUNTER — Ambulatory Visit (HOSPITAL_BASED_OUTPATIENT_CLINIC_OR_DEPARTMENT_OTHER): Payer: BC Managed Care – PPO | Admitting: Physical Therapy

## 2021-07-18 DIAGNOSIS — M25562 Pain in left knee: Secondary | ICD-10-CM | POA: Diagnosis not present

## 2021-07-18 DIAGNOSIS — Z4789 Encounter for other orthopedic aftercare: Secondary | ICD-10-CM

## 2021-07-18 DIAGNOSIS — M25662 Stiffness of left knee, not elsewhere classified: Secondary | ICD-10-CM | POA: Diagnosis not present

## 2021-07-18 NOTE — Therapy (Signed)
OUTPATIENT PHYSICAL THERAPY TREATMENT NOTE   Patient Name: Blake Gomez MRN: [id number] DOB:[birth year], 61 y.o., male Today's Date: 07/18/2021  PCP: Orma Render, NP REFERRING PROVIDER: Orma Render, NP   PT End of Session - 07/18/21 [phone]     Visit Number 2    Number of Visits 12    Date for PT Re-Evaluation 08/28/21    Authorization Type BCBS PPO    PT Start Time 0910    PT Stop Time 0953    PT Time Calculation (min) 43 min    Activity Tolerance Patient tolerated treatment well    Behavior During Therapy [facility] for tasks assessed/performed             Past Medical History:  Diagnosis Date   Bronchitis, mucopurulent recurrent ([place]) 04/2021   CAD (coronary artery disease)    stent 2014.  No MI.  Echo normal.   Chronic renal insufficiency, stage 2 (mild)    GERD (gastroesophageal reflux disease)    Hay fever    Hematochezia 2017; 2019   Hematochezia/screening: 2017 Colonoscopy normal except adenomatous polyp.  2019 colonoscopy, +polyp, recall 2022.   History of adenomatous polyp of colon 2017; 2019   Recall 2022   History of hematuria    per old records   Hypercholesterolemia    Hypertension    Left knee pain 2021   MRI 10/2019-> moderate DJD medial mostly, +deg tear of medial and lateral menisci, +effusion, +popliteal cyst->recommended ortho   Obesity, Class II, BMI 35-39.9    Unilateral primary osteoarthritis, left knee    + deg menisc tears: Dr. Marlou Sa, steroid inj 11/2019, pt wants to avoid surgery   Past Surgical History:  Procedure Laterality Date   CARDIAC CATHETERIZATION  09/2008   95% LAD lesion-->DES   CARDIOVASCULAR STRESS TEST  2015   cardiolyte->no ischemia.  EF 60%.   COLONOSCOPY  2017/2019   Hematochezia/screening: 2017 Colonoscopy normal except adenomatous polyp.  2019 colonoscopy, +polyp, recall 2022.   CORONARY ANGIOPLASTY WITH STENT PLACEMENT  2010   DES to LAD   KNEE ARTHROSCOPY Left 07/12/2021   Procedure: Left ARTHROSCOPY KNEE / with  debridement;  Surgeon: Vanetta Mulders, MD;  Location: [place];  Service: Orthopedics;  Laterality: Left;   RIGHT/LEFT HEART CATH AND CORONARY ANGIOGRAPHY N/A 06/12/2021   Procedure: RIGHT/LEFT HEART CATH AND CORONARY ANGIOGRAPHY;  Surgeon: Belva Crome, MD;  Location: [place] [facility];  Service: Cardiovascular;  Laterality: N/A;   Patient Active Problem List   Diagnosis Date Noted   Acute meniscal tear of left knee    Chronic systolic heart failure ([place])    Hypertension 05/03/2021   Skin rash 05/03/2021   History of colon polyps 05/03/2021   Chronic pain of left knee 05/03/2021   Bronchitis 05/03/2021   Hemorrhoids 03/21/2016   Hyperlipidemia 10/15/2008   Acute renal failure syndrome ([place]) 10/13/2008   Acute coronary syndrome ([place]) 10/13/2008   Precordial pain 10/13/2008     PCP: Orma Render, NP   REFERRING PROVIDER: Dr Donivan Scull    REFERRING DIAG: Medial and Lateral Meniscus Tear         THERAPY DIAG:  Acute pain of left knee   Stiffness of left knee, not elsewhere classified   Encounter for prosthetic gait training   ONSET DATE: Surgery on 07/12/2021    SUBJECTIVE:    SUBJECTIVE STATEMENT: Patient reports his knee is doing well today. He is having very little pain. He has been working on his exercises  and feels like his knee is moving better.    PERTINENT HISTORY: CAD, Left Knee OA    PAIN:  Are you having pain? Yes NPRS scale: 0/10 Pain location: left knee  Pain orientation: Right, Medial, and Lateral  PAIN TYPE: aching Pain description: intermittent  Aggravating factors: Twisting  Relieving factors: Ice  PRECAUTIONS: None   WEIGHT BEARING RESTRICTIONS Yes WBAT    FALLS:  Has patient fallen in last 6 months? No, Number of falls:    LIVING ENVIRONMENT: No steps of stairs int he house  OCCUPATION: Plumber    Recreation: going to the gym    PLOF: Independent   PATIENT GOALS:  To get back into the gym and back to work   OBJECTIVE:     0-130 after manual therapy. Mild extension deficit prior     TODAY'S TREATMENT: 1/25 Quad set 2x15  SLR 3x10 SAQ 3x10 Supnie clam 3x10 green  Manual: PROM into flexion and extension      Eval  seated Hamstring Stretch - 1 x daily - 7 x weekly - 3 sets - 10 reps Supine Heel Slide - 2 x daily - 7 x weekly - 1 sets - 3 reps - 10 hold Supine Quad Set - 1 x daily - 7 x weekly - 3 sets - 10 reps Supine Straight Leg Raises - 1 x daily - 7 x weekly - 3 sets - 10 reps       PATIENT EDUCATION:  Education details: HEP, symptom management; activity progression  Person educated: Patient Education method: Explanation, Demonstration, Tactile cues, Verbal cues, and Handouts Education comprehension: verbalized understanding, returned demonstration, verbal cues required, and tactile cues required     HOME EXERCISE PROGRAM: Access Code: [email or web address] URL: [email or web address] Date: 07/16/2021 Prepared by: Carolyne Littles   Exercises Supine Knee Extension Stretch on Towel Roll - 3 x daily - 7 x weekly - 2 sets - 5 reps - 10 sec hold Seated Hamstring Stretch - 1 x daily - 7 x weekly - 3 sets - 10 reps Supine Heel Slide - 2 x daily - 7 x weekly - 1 sets - 3 reps - 10 hold Supine Quad Set - 1 x daily - 7 x weekly - 3 sets - 10 reps Supine Straight Leg Raises - 1 x daily - 7 x weekly - 3 sets - 10 reps     ASSESSMENT:   CLINICAL IMPRESSION: Patient is making great progress. Therapy was able to advance his range of motion to 0-130 without pain. We added in weight bearing exercises without difficulty. Therapy will continue to progress as tolerated. We also advanced his sets and added in supine clamshell. He had no pain with anything.     Objective impairments include Abnormal gait, decreased activity tolerance, decreased endurance, decreased mobility, decreased ROM, decreased strength, increased edema, impaired sensation, and pain. These impairments are limiting patient from  cleaning, driving, meal prep, occupation, and shopping. Personal factors including Profession and 1 comorbidity: CAD   are also affecting patient's functional outcome. Patient will benefit from skilled PT to address above impairments and improve overall function.   REHAB POTENTIAL: Excellent   CLINICAL DECISION MAKING: Stable/uncomplicated   EVALUATION COMPLEXITY: Low     GOALS: Goals reviewed with patient? Yes   SHORT TERM GOALS:   STG Name Target Date Goal status  1 Patient will demonstrate full extension  Baseline:  08/07/2021 INITIAL  2 Patient will demonstrate 125 degrees of knee flexion without pain  Baseline:  08/07/2021 INITIAL  3 Patient will increase gross left LE strength by 5 lbs  Baseline: 08/07/2021 INITIAL  LONG TERM GOALS:    LTG Name Target Date Goal status  1 Patient will be able to get on his hands and knees without pain in order to progress to work.  Baseline: 08/28/2021 INITIAL  2 Patient will stand for 45 minutes without increased pain in order to perform work tasks.  Baseline: 08/28/2021 INITIAL  3 Patient will demonstrate an 88% ability on FOTO in order to demonstrate improved self perceived function   Baseline: 08/28/2021 INITIAL  PLAN: PT FREQUENCY: 2x/week   PT DURATION: 8 weeks   PLANNED INTERVENTIONS: Therapeutic exercises, Therapeutic activity, Neuro Muscular re-education, Balance training, Gait training, Patient/Family education, Joint mobilization, Stair training, DME instructions, Aquatic Therapy, Dry Needling, Electrical stimulation, Cryotherapy, Moist heat, Taping, and Manual therapy   PLAN FOR NEXT SESSION:  Review reaction to HEP; progress to standing exercises; Manula therapy depending on range; add in TKE and SAQ; Standing weight shift; Standing slow march        Carney Living, PT 07/18/2021, 3:50 PM

## 2021-07-20 ENCOUNTER — Other Ambulatory Visit (HOSPITAL_BASED_OUTPATIENT_CLINIC_OR_DEPARTMENT_OTHER): Payer: Self-pay | Admitting: Nurse Practitioner

## 2021-07-20 DIAGNOSIS — G629 Polyneuropathy, unspecified: Secondary | ICD-10-CM | POA: Insufficient documentation

## 2021-07-20 DIAGNOSIS — G6289 Other specified polyneuropathies: Secondary | ICD-10-CM

## 2021-07-20 MED ORDER — GABAPENTIN 100 MG PO CAPS: 100.0000 mg | ORAL_CAPSULE | Freq: Three times a day (TID) | ORAL | 5 refills | Status: DC | PRN

## 2021-07-25 ENCOUNTER — Ambulatory Visit (HOSPITAL_BASED_OUTPATIENT_CLINIC_OR_DEPARTMENT_OTHER): Payer: BC Managed Care – PPO | Attending: Orthopaedic Surgery | Admitting: Physical Therapy

## 2021-07-25 ENCOUNTER — Encounter (HOSPITAL_BASED_OUTPATIENT_CLINIC_OR_DEPARTMENT_OTHER): Payer: Self-pay | Admitting: Physical Therapy

## 2021-07-25 ENCOUNTER — Other Ambulatory Visit: Payer: Self-pay

## 2021-07-25 DIAGNOSIS — M25562 Pain in left knee: Secondary | ICD-10-CM | POA: Diagnosis not present

## 2021-07-25 DIAGNOSIS — M25662 Stiffness of left knee, not elsewhere classified: Secondary | ICD-10-CM | POA: Insufficient documentation

## 2021-07-25 NOTE — Therapy (Signed)
OUTPATIENT PHYSICAL THERAPY TREATMENT NOTE   Patient Name: Blake Gomez MRN: [id number] DOB:Jun 21, 1961, 61 y.o., male Today's Date: 07/25/2021  PCP: Orma Render, NP REFERRING PROVIDER: Orma Render, NP   PT End of Session - 07/25/21 0855     Visit Number 3    Number of Visits 12    Date for PT Re-Evaluation 08/28/21    Authorization Type [employer] PPO    PT Start Time 0845    PT Stop Time 0925    PT Time Calculation (min) 40 min    Activity Tolerance Patient tolerated treatment well    Behavior During Therapy [facility] for tasks assessed/performed              Past Medical History:  Diagnosis Date   Bronchitis, mucopurulent recurrent ([place]) 04/2021   CAD (coronary artery disease)    stent 2014.  No MI.  Echo normal.   Chronic renal insufficiency, stage 2 (mild)    GERD (gastroesophageal reflux disease)    Hay fever    Hematochezia 2017; 2019   Hematochezia/screening: 2017 Colonoscopy normal except adenomatous polyp.  2019 colonoscopy, +polyp, recall 2022.   History of adenomatous polyp of colon 2017; 2019   Recall 2022   History of hematuria    per old records   Hypercholesterolemia    Hypertension    Left knee pain 2021   MRI 10/2019-> moderate DJD medial mostly, +deg tear of medial and lateral menisci, +effusion, +popliteal cyst->recommended ortho   Obesity, Class II, BMI 35-39.9    Unilateral primary osteoarthritis, left knee    + deg menisc tears: Dr. Marlou Sa, steroid inj 11/2019, pt wants to avoid surgery   Past Surgical History:  Procedure Laterality Date   CARDIAC CATHETERIZATION  09/2008   95% LAD lesion-->DES   CARDIOVASCULAR STRESS TEST  2015   cardiolyte->no ischemia.  EF 60%.   COLONOSCOPY  2017/2019   Hematochezia/screening: 2017 Colonoscopy normal except adenomatous polyp.  2019 colonoscopy, +polyp, recall 2022.   CORONARY ANGIOPLASTY WITH STENT PLACEMENT  2010   DES to LAD   KNEE ARTHROSCOPY Left 07/12/2021   Procedure: Left ARTHROSCOPY KNEE / with  debridement;  Surgeon: Vanetta Mulders, MD;  Location: [facility];  Service: Orthopedics;  Laterality: Left;   RIGHT/LEFT HEART CATH AND CORONARY ANGIOGRAPHY N/A 06/12/2021   Procedure: RIGHT/LEFT HEART CATH AND CORONARY ANGIOGRAPHY;  Surgeon: Belva Crome, MD;  Location: [place] [facility];  Service: Cardiovascular;  Laterality: N/A;   Patient Active Problem List   Diagnosis Date Noted   Peripheral neuropathy 07/20/2021   Acute meniscal tear of left knee    Chronic systolic heart failure ([place])    Hypertension 05/03/2021   Skin rash 05/03/2021   History of colon polyps 05/03/2021   Chronic pain of left knee 05/03/2021   Bronchitis 05/03/2021   Hemorrhoids 03/21/2016   Hyperlipidemia 10/15/2008   Acute renal failure syndrome ([place]) 10/13/2008   Acute coronary syndrome ([place]) 10/13/2008   Precordial pain 10/13/2008     PCP: Orma Render, NP   REFERRING PROVIDER: Dr Donivan Scull    REFERRING DIAG: Medial and Lateral Meniscus Tear         THERAPY DIAG:  Acute pain of left knee   Stiffness of left knee, not elsewhere classified   Encounter for prosthetic gait training   ONSET DATE: Surgery on 07/12/2021    SUBJECTIVE:    SUBJECTIVE STATEMENT: Pt states he is doing well. No pain at rest and trying ot walk about .  5 miles a day.    PERTINENT HISTORY: CAD, Left Knee OA    PAIN:  Are you having pain? Yes NPRS scale: 0/10 Pain location: left knee  Pain orientation: Right, Medial, and Lateral  PAIN TYPE: aching Pain description: intermittent  Aggravating factors: Twisting  Relieving factors: Ice  PRECAUTIONS: None   WEIGHT BEARING RESTRICTIONS Yes WBAT    FALLS:  Has patient fallen in last 6 months? No, Number of falls:    LIVING ENVIRONMENT: No steps of stairs int he house  OCCUPATION: Plumber    Recreation: going to the gym    PLOF: Independent   PATIENT GOALS:  To get back into the gym and back to work   OBJECTIVE:    0-130 after manual therapy. Mild  extension deficit prior     TODAY'S TREATMENT:   2/1   SLR 3x10 rest between each rep SAQ 3x10 2lb weight Standing TKE YTB 2x10 Sidestepping YTB 2x10 LAQ 2lb 2x10 Standing HS curl 1lb 2x10 Standing HR 3x10    1/25 Quad set 2x15  SLR 3x10 SAQ 3x10 Supnie clam 3x10 green  Manual: PROM into flexion and extension      Eval  seated Hamstring Stretch - 1 x daily - 7 x weekly - 3 sets - 10 reps Supine Heel Slide - 2 x daily - 7 x weekly - 1 sets - 3 reps - 10 hold Supine Quad Set - 1 x daily - 7 x weekly - 3 sets - 10 reps Supine Straight Leg Raises - 1 x daily - 7 x weekly - 3 sets - 10 reps       PATIENT EDUCATION:  Education details: HEP, symptom management; activity progression  Person educated: Patient Education method: Explanation, Demonstration, Tactile cues, Verbal cues, and Handouts Education comprehension: verbalized understanding, returned demonstration, verbal cues required, and tactile cues required     HOME EXERCISE PROGRAM: Access Code: [email or web address] URL: [email or web address] Date: 07/16/2021 Prepared by: Carolyne Littles   Exercises Supine Knee Extension Stretch on Towel Roll - 3 x daily - 7 x weekly - 2 sets - 5 reps - 10 sec hold Seated Hamstring Stretch - 1 x daily - 7 x weekly - 3 sets - 10 reps Supine Heel Slide - 2 x daily - 7 x weekly - 1 sets - 3 reps - 10 hold Supine Quad Set - 1 x daily - 7 x weekly - 3 sets - 10 reps Supine Straight Leg Raises - 1 x daily - 7 x weekly - 3 sets - 10 reps     ASSESSMENT:   CLINICAL IMPRESSION: Pt with demonstrates full AROM at today's session. However continues to have extensor lag with SLR at this time. Session focused on progressing strengthening and maintaining AROM TKE. Plan to continue with progression L LE knee extensor, HS, and hip ABD strength as tolerated. If no increased pain or excessive soreness, pt likely about to update HEP to sidestepping and STS. Goal is to return to full kneeling  to standing transfers for work.     Objective impairments include Abnormal gait, decreased activity tolerance, decreased endurance, decreased mobility, decreased ROM, decreased strength, increased edema, impaired sensation, and pain. These impairments are limiting patient from cleaning, driving, meal prep, occupation, and shopping. Personal factors including Profession and 1 comorbidity: CAD   are also affecting patient's functional outcome. Patient will benefit from skilled PT to address above impairments and improve overall function.   REHAB POTENTIAL: Excellent   CLINICAL DECISION MAKING:  Stable/uncomplicated   EVALUATION COMPLEXITY: Low     GOALS: Goals reviewed with patient? Yes   SHORT TERM GOALS:   STG Name Target Date Goal status  1 Patient will demonstrate full extension  Baseline:  08/07/2021 INITIAL  2 Patient will demonstrate 125 degrees of knee flexion without pain  Baseline:  08/07/2021 INITIAL  3 Patient will increase gross left LE strength by 5 lbs  Baseline: 08/07/2021 INITIAL  LONG TERM GOALS:    LTG Name Target Date Goal status  1 Patient will be able to get on his hands and knees without pain in order to progress to work.  Baseline: 08/28/2021 INITIAL  2 Patient will stand for 45 minutes without increased pain in order to perform work tasks.  Baseline: 08/28/2021 INITIAL  3 Patient will demonstrate an 88% ability on FOTO in order to demonstrate improved self perceived function   Baseline: 08/28/2021 INITIAL  PLAN: PT FREQUENCY: 2x/week   PT DURATION: 8 weeks   PLANNED INTERVENTIONS: Therapeutic exercises, Therapeutic activity, Neuro Muscular re-education, Balance training, Gait training, Patient/Family education, Joint mobilization, Stair training, DME instructions, Aquatic Therapy, Dry Needling, Electrical stimulation, Cryotherapy, Moist heat, Taping, and Manual therapy   PLAN FOR NEXT SESSION:  Review reaction to HEP; progress to standing exercises; [place] therapy  depending on range; add in TKE and SAQ; Standing weight shift; Standing slow march        Adonna Horsley, PT 07/25/2021, 9:30 AM

## 2021-07-26 ENCOUNTER — Ambulatory Visit (INDEPENDENT_AMBULATORY_CARE_PROVIDER_SITE_OTHER): Payer: BC Managed Care – PPO | Admitting: Orthopaedic Surgery

## 2021-07-26 DIAGNOSIS — S83207A Unspecified tear of unspecified meniscus, current injury, left knee, initial encounter: Secondary | ICD-10-CM

## 2021-07-26 NOTE — Progress Notes (Signed)
Post Operative Evaluation    Procedure/Date of Surgery: Left medial and lateral knee meniscectomies done on July 12, 2021  Interval History:   Presents today 2 weeks status post the above procedure.  Overall he is doing extremely well.  He is walking approximately a mile a day.  He really has no pain at this time.  He is very satisfied with procedure.   PMH/PSH/Family History/Social History/Meds/Allergies:    Past Medical History:  Diagnosis Date   Bronchitis, mucopurulent recurrent ([place]) 04/2021   CAD (coronary artery disease)    stent 2014.  No MI.  Echo normal.   Chronic renal insufficiency, stage 2 (mild)    GERD (gastroesophageal reflux disease)    Hay fever    Hematochezia 2017; 2019   Hematochezia/screening: 2017 Colonoscopy normal except adenomatous polyp.  2019 colonoscopy, +polyp, recall 2022.   History of adenomatous polyp of colon 2017; 2019   Recall 2022   History of hematuria    per old records   Hypercholesterolemia    Hypertension    Left knee pain 2021   MRI 10/2019-> moderate DJD medial mostly, +deg tear of medial and lateral menisci, +effusion, +popliteal cyst->recommended ortho   Obesity, Class II, BMI 35-39.9    Unilateral primary osteoarthritis, left knee    + deg menisc tears: Dr. Marlou Sa, steroid inj 11/2019, pt wants to avoid surgery   Past Surgical History:  Procedure Laterality Date   CARDIAC CATHETERIZATION  09/2008   95% LAD lesion-->DES   CARDIOVASCULAR STRESS TEST  2015   cardiolyte->no ischemia.  EF 60%.   COLONOSCOPY  2017/2019   Hematochezia/screening: 2017 Colonoscopy normal except adenomatous polyp.  2019 colonoscopy, +polyp, recall 2022.   CORONARY ANGIOPLASTY WITH STENT PLACEMENT  2010   DES to LAD   KNEE ARTHROSCOPY Left 07/12/2021   Procedure: Left ARTHROSCOPY KNEE / with debridement;  Surgeon: Vanetta Mulders, MD;  Location: [facility];  Service: Orthopedics;  Laterality: Left;   RIGHT/LEFT HEART CATH  AND CORONARY ANGIOGRAPHY N/A 06/12/2021   Procedure: RIGHT/LEFT HEART CATH AND CORONARY ANGIOGRAPHY;  Surgeon: Belva Crome, MD;  Location: [facility];  Service: Cardiovascular;  Laterality: N/A;   Social History   Socioeconomic History   Marital status: Married    Spouse name: Not on file   Number of children: Not on file   Years of education: Not on file   Highest education level: Not on file  Occupational History   Not on file  Tobacco Use   Smoking status: Never   Smokeless tobacco: Never  Vaping Use   Vaping Use: Never used  Substance and Sexual Activity   Alcohol use: Never   Drug use: Never   Sexual activity: Yes  Other Topics Concern   Not on file  Social History Narrative   Married, wife [name].  No children.   Relocated from [facility] b/c lost job due to covid.   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   Lives on [address].   No T/A/Ds.   Social Determinants of Health   Financial Resource Strain: Not on file  Food Insecurity: Not on file  Transportation Needs: Not on file  Physical Activity: Not on file  Stress: Not on file  Social Connections: Not on file   Family History  Problem Relation Age of Onset   Heart disease Mother    Diverticulitis Mother    Cancer Father    Early death Brother        car accident   Alzheimer's disease Maternal Grandmother    No Known Allergies Current Outpatient Medications  Medication Sig Dispense Refill   aspirin 81 MG EC tablet Take 1 tablet (81 mg total) by mouth daily. Swallow whole. 90 tablet 3   atorvastatin (LIPITOR) 80 MG tablet Take once a day for cholesterol 90 tablet 3   benzonatate (TESSALON) 100 MG capsule Take 1 capsule (100 mg total) by mouth 3 (three) times daily as needed for cough. (Patient not taking: Reported on 07/03/2021) 40 capsule 3   clopidogrel (PLAVIX) 75 MG tablet Take one tablet a day for artery disease 90 tablet 3   dapagliflozin propanediol (FARXIGA) 10 MG  TABS tablet Take 1 tablet (10 mg total) by mouth daily before breakfast. 30 tablet 6   Fish Oil-Krill Oil (MEGARED ADVANCED 4 IN 1 PO) Take by mouth daily.     folic acid (FOLVITE) Q000111Q MCG tablet folic acid Q000111Q mcg tablet  Take 1 tablet every day by oral route.     gabapentin (NEURONTIN) 100 MG capsule Take 1-3 capsules (100-300 mg total) by mouth 3 (three) times daily as needed. For nerve pain. Use smallest dose needed to help with pain. Will cause drowsiness. 90 capsule 5   ketoconazole (NIZORAL) 2 % shampoo Apply 1 application topically 2 (two) times a week. 120 mL 0   metoprolol succinate (TOPROL-XL) 25 MG 24 hr tablet Take 0.5 tablets (12.5 mg total) by mouth daily. 45 tablet 3   Multiple Vitamins-Minerals (CENTRUM SILVER PO) Centrum Silver Men 300 mcg-600 mcg-300 mcg tablet  Take 1 tablet every day by oral route.     nitroGLYCERIN (NITROSTAT) 0.4 MG SL tablet Place 1 tablet (0.4 mg total) under the tongue every 5 (five) minutes as needed for chest pain. 25 tablet 3   oxyCODONE (OXY IR/ROXICODONE) 5 MG immediate release tablet Take 1 tablet (5 mg total) by mouth every 4 (four) hours as needed for severe pain. 30 tablet 0   oxycodone (OXY-IR) 5 MG capsule Take 1 capsule (5 mg total) by mouth every 4 (four) hours as needed (severe pain). 20 capsule 0   predniSONE (DELTASONE) 20 MG tablet Take 2 tablets (40 mg total) by mouth daily with breakfast. For 5 days- for bronchitis (Patient not taking: Reported on 07/03/2021) 10 tablet 0   spironolactone (ALDACTONE) 25 MG tablet Take 1 tablet (25 mg total) by mouth daily. 90 tablet 3   No current facility-administered medications for this visit.   No results found.  Review of Systems:   A ROS was performed including pertinent positives and negatives as documented in the HPI.   Musculoskeletal Exam:    There were no vitals taken for this visit.  Incisions are well-healed.  Range of motion is from 0-135 without pain.  Incisions intact  throughout  Imaging:    None  I personally reviewed and interpreted the radiographs.   Assessment:   61 year old male 2-week status post, overall doing extremely well.  He may be activity as tolerated.  He may return to work on the following Monday.  Plan :    -Return to clinic as needed     I personally saw and evaluated the patient, and participated in the management and treatment plan.  Vanetta Mulders, MD Attending Physician, Orthopedic Surgery  This  document was dictated using Systems analyst. A reasonable attempt at proof reading has been made to minimize errors.

## 2021-07-27 ENCOUNTER — Encounter (HOSPITAL_BASED_OUTPATIENT_CLINIC_OR_DEPARTMENT_OTHER): Payer: Self-pay

## 2021-07-27 ENCOUNTER — Ambulatory Visit (HOSPITAL_BASED_OUTPATIENT_CLINIC_OR_DEPARTMENT_OTHER): Payer: BC Managed Care – PPO | Admitting: Physical Therapy

## 2021-08-03 ENCOUNTER — Other Ambulatory Visit: Payer: Self-pay

## 2021-08-03 ENCOUNTER — Encounter (HOSPITAL_BASED_OUTPATIENT_CLINIC_OR_DEPARTMENT_OTHER): Payer: Self-pay | Admitting: Nurse Practitioner

## 2021-08-03 ENCOUNTER — Ambulatory Visit (INDEPENDENT_AMBULATORY_CARE_PROVIDER_SITE_OTHER): Payer: BC Managed Care – PPO | Admitting: Nurse Practitioner

## 2021-08-03 VITALS — BP 122/72 | HR 62 | Ht 70.0 in | Wt 279.0 lb

## 2021-08-03 DIAGNOSIS — Z0000 Encounter for general adult medical examination without abnormal findings: Secondary | ICD-10-CM

## 2021-08-03 DIAGNOSIS — G6289 Other specified polyneuropathies: Secondary | ICD-10-CM

## 2021-08-03 DIAGNOSIS — I10 Essential (primary) hypertension: Secondary | ICD-10-CM

## 2021-08-03 DIAGNOSIS — I739 Peripheral vascular disease, unspecified: Secondary | ICD-10-CM

## 2021-08-03 DIAGNOSIS — R21 Rash and other nonspecific skin eruption: Secondary | ICD-10-CM | POA: Diagnosis not present

## 2021-08-03 HISTORY — DX: Encounter for general adult medical examination without abnormal findings: Z00.00

## 2021-08-03 MED ORDER — MOMETASONE FUROATE 0.1 % EX OINT: TOPICAL_OINTMENT | CUTANEOUS | 3 refills | Status: AC

## 2021-08-03 MED ORDER — AMBULATORY NON FORMULARY MEDICATION: 0 refills | Status: AC

## 2021-08-03 NOTE — Patient Instructions (Addendum)
Foot Pain:  Let's try increasing the Gabapentin to 2 capsules in the morning and afternoon and 3 capsules before bedtime. You can increase this to 3 capsules 3 times a day if the pain continues. If this does not help, please call me or send me a message and we can make changes if needed.   You just had lab work so we don't need to repeat any of that today. We can plan to follow-up in 3-4 months to check your cholesterol and see how the neuropathy is doing.   I am so glad your surgery went well!!  T

## 2021-08-03 NOTE — Progress Notes (Signed)
BP 122/72    Pulse 62    Ht 5\' 10"  (1.778 m)    Wt 279 lb (126.6 kg)    SpO2 97%    BMI 40.03 kg/m    Subjective:    Patient ID: Blake Gomez, male    DOB: 06-22-1961, 61 y.o.   MRN: [id number]  HPI: Blake Gomez is a 61 y.o. male presenting on 08/03/2021 for comprehensive medical examination.   Current medical concerns include: burning and numbness in feet bilaterally.   He reports regular vision exams q1-5y: no He reports regular dental exams q 44m: no His diet consists of:  mixture of healthy and unhealthy options. He endorses exercise and/or activity of:  lifting and bending with physically active profession He works as:  Development worker, community  He denies ETOH use  He denies nictoine use  He denies illegal substance use  He is sexually active with his wife only.   He denies concerns today about STI  He denies concerns about skin changes today  He denies concerns about bowel changes today  He denies concerns about bladder changes today   Most Recent Depression Screen:  Depression screen [facility] 2/9 08/03/2021 05/03/2021 09/30/2019  Decreased Interest 0 0 0  Down, Depressed, Hopeless 0 0 0  PHQ - 2 Score 0 0 0  Altered sleeping - 0 -  Tired, decreased energy - 0 -  Change in appetite - 0 -  Feeling bad or failure about yourself  - 0 -  Trouble concentrating - 0 -  Moving slowly or fidgety/restless - 0 -  Suicidal thoughts - 0 -  PHQ-9 Score - 0 -  Difficult doing work/chores - Not difficult at all -   Most Recent Anxiety Screen: GAD 7 : Generalized Anxiety Score 05/03/2021  Nervous, Anxious, on Edge 0  Control/stop worrying 0  Worry too much - different things 0  Trouble relaxing 0  Restless 0  Easily annoyed or irritable 0  Afraid - awful might happen 0  Total GAD 7 Score 0  Anxiety Difficulty Not difficult at all   Most Recent Falls Screen: Fall Risk  08/03/2021 05/03/2021  Falls in the past year? 0 0  Number falls in past yr: 0 0  Injury with Fall? 0 0  Risk for fall due to :  No Fall Risks No Fall Risks  Follow up Falls evaluation completed Falls evaluation completed;Education provided    Past medical history, surgical history, medications, allergies, family history and social history reviewed with patient today and changes made to appropriate areas of the chart.  Past Medical History:  Past Medical History:  Diagnosis Date   Bronchitis, mucopurulent recurrent ([place]) 04/2021   CAD (coronary artery disease)    stent 2014.  No MI.  Echo normal.   Chronic renal insufficiency, stage 2 (mild)    GERD (gastroesophageal reflux disease)    Hay fever    Hematochezia 2017; 2019   Hematochezia/screening: 2017 Colonoscopy normal except adenomatous polyp.  2019 colonoscopy, +polyp, recall 2022.   Hemorrhoids 03/21/2016   History of adenomatous polyp of colon 2017; 2019   Recall 2022   History of hematuria    per old records   Hypercholesterolemia    Hypertension    Left knee pain 2021   MRI 10/2019-> moderate DJD medial mostly, +deg tear of medial and lateral menisci, +effusion, +popliteal cyst->recommended ortho   Obesity, Class II, BMI 35-39.9    Unilateral primary osteoarthritis, left knee    + deg menisc  tears: Dr. Marlou Sa, steroid inj 11/2019, pt wants to avoid surgery   Medications:  Current Outpatient Medications on File Prior to Visit  Medication Sig   aspirin 81 MG EC tablet Take 1 tablet (81 mg total) by mouth daily. Swallow whole.   atorvastatin (LIPITOR) 80 MG tablet Take once a day for cholesterol   clopidogrel (PLAVIX) 75 MG tablet Take one tablet a day for artery disease   dapagliflozin propanediol (FARXIGA) 10 MG TABS tablet Take 1 tablet (10 mg total) by mouth daily before breakfast.   Fish Oil-Krill Oil (MEGARED ADVANCED 4 IN 1 PO) Take by mouth daily.   folic acid (FOLVITE) Q000111Q MCG tablet folic acid Q000111Q mcg tablet  Take 1 tablet every day by oral route.   gabapentin (NEURONTIN) 100 MG capsule Take 1-3 capsules (100-300 mg total) by mouth 3 (three)  times daily as needed. For nerve pain. Use smallest dose needed to help with pain. Will cause drowsiness.   ketoconazole (NIZORAL) 2 % shampoo Apply 1 application topically 2 (two) times a week.   metoprolol succinate (TOPROL-XL) 25 MG 24 hr tablet Take 0.5 tablets (12.5 mg total) by mouth daily.   Multiple Vitamins-Minerals (CENTRUM SILVER PO) Centrum Silver Men 300 mcg-600 mcg-300 mcg tablet  Take 1 tablet every day by oral route.   nitroGLYCERIN (NITROSTAT) 0.4 MG SL tablet Place 1 tablet (0.4 mg total) under the tongue every 5 (five) minutes as needed for chest pain.   oxyCODONE (OXY IR/ROXICODONE) 5 MG immediate release tablet Take 1 tablet (5 mg total) by mouth every 4 (four) hours as needed for severe pain.   oxycodone (OXY-IR) 5 MG capsule Take 1 capsule (5 mg total) by mouth every 4 (four) hours as needed (severe pain).   spironolactone (ALDACTONE) 25 MG tablet Take 1 tablet (25 mg total) by mouth daily.   No current facility-administered medications on file prior to visit.   Surgical History:  Past Surgical History:  Procedure Laterality Date   CARDIAC CATHETERIZATION  09/2008   95% LAD lesion-->DES   CARDIOVASCULAR STRESS TEST  2015   cardiolyte->no ischemia.  EF 60%.   COLONOSCOPY  2017/2019   Hematochezia/screening: 2017 Colonoscopy normal except adenomatous polyp.  2019 colonoscopy, +polyp, recall 2022.   CORONARY ANGIOPLASTY WITH STENT PLACEMENT  2010   DES to LAD   KNEE ARTHROSCOPY Left 07/12/2021   Procedure: Left ARTHROSCOPY KNEE / with debridement;  Surgeon: Vanetta Mulders, MD;  Location: [place];  Service: Orthopedics;  Laterality: Left;   RIGHT/LEFT HEART CATH AND CORONARY ANGIOGRAPHY N/A 06/12/2021   Procedure: RIGHT/LEFT HEART CATH AND CORONARY ANGIOGRAPHY;  Surgeon: Belva Crome, MD;  Location: [place] [facility];  Service: Cardiovascular;  Laterality: N/A;   Allergies:  No Known Allergies Social History:  Social History   Socioeconomic History   Marital  status: Married    Spouse name: Not on file   Number of children: Not on file   Years of education: Not on file   Highest education level: Not on file  Occupational History   Not on file  Tobacco Use   Smoking status: Never   Smokeless tobacco: Never  Vaping Use   Vaping Use: Never used  Substance and Sexual Activity   Alcohol use: Never   Drug use: Never   Sexual activity: Yes  Other Topics Concern   Not on file  Social History Narrative   Married, wife [name].  No children.   Relocated from [place] b/c lost job  due to covid.   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   Lives on [place].   No T/A/Ds.   Social Determinants of Health   Financial Resource Strain: Not on file  Food Insecurity: Not on file  Transportation Needs: Not on file  Physical Activity: Not on file  Stress: Not on file  Social Connections: Not on file  Intimate Partner Violence: Not on file   Social History   Tobacco Use  Smoking Status Never  Smokeless Tobacco Never   Social History   Substance and Sexual Activity  Alcohol Use Never   Family History:  Family History  Problem Relation Age of Onset   Heart disease Mother    Diverticulitis Mother    Cancer Father    B[place]ley Wiley death Brother        car accident   Alzheimer's disease Maternal Grandmother      All ROS negative except what is listed above and in the HPI.      Objective:    BP 122/72    Pulse 62    Ht 5\' 10"  (1.778 m)    Wt 279 lb (126.6 kg)    SpO2 97%    BMI 40.03 kg/m   Wt Readings from Last 3 Encounters:  08/03/21 279 lb (126.6 kg)  07/12/21 270 lb (122.5 kg)  07/03/21 281 lb 12.8 oz (127.8 kg)    Physical Exam Vitals and nursing note reviewed.  Constitutional:      General: He is not in acute distress.    Appearance: Normal appearance. He is obese.  HENT:     Head: Normocephalic and atraumatic.     Right Ear: Hearing, tympanic membrane, ear canal and external ear normal.     Left  Ear: Hearing, tympanic membrane, ear canal and external ear normal.     Nose: Nose normal.     Right Sinus: No maxillary sinus tenderness or frontal sinus tenderness.     Left Sinus: No maxillary sinus tenderness or frontal sinus tenderness.     Mouth/Throat:     Lips: Pink.     Mouth: Mucous membranes are moist.     Pharynx: Oropharynx is clear.  Eyes:     General: Lids are normal. Vision grossly intact.     Extraocular Movements: Extraocular movements intact.     Conjunctiva/sclera: Conjunctivae normal.     Pupils: Pupils are equal, round, and reactive to light.     Funduscopic exam:    Right eye: No hemorrhage. Red reflex present.        Left eye: No hemorrhage. Red reflex present.    Visual Fields: Right eye visual fields normal and left eye visual fields normal.  Neck:     Thyroid: No thyromegaly.     Vascular: No carotid bruit or JVD.  Cardiovascular:     Rate and Rhythm: Normal rate and regular rhythm.     Chest Wall: PMI is not displaced.     Pulses: Normal pulses.          Dorsalis pedis pulses are 2+ on the right side and 2+ on the left side.       Posterior tibial pulses are 2+ on the right side and 2+ on the left side.     Heart sounds: Normal heart sounds. No murmur heard. Pulmonary:     Effort: Pulmonary effort is normal. No respiratory distress.     Breath sounds: Normal breath sounds.  Chest:  Breasts:  Breasts are symmetrical.  Abdominal:     General: Bowel sounds are normal. There is no distension or abdominal bruit.     Palpations: Abdomen is soft. There is no hepatomegaly, splenomegaly or mass.     Tenderness: There is no abdominal tenderness. There is no right CVA tenderness, left CVA tenderness, guarding or rebound.  Musculoskeletal:        General: Normal range of motion.     Cervical back: Full passive range of motion without pain, normal range of motion and neck supple. No tenderness. No spinous process tenderness or muscular tenderness.     Right  lower leg: No edema.     Left lower leg: No edema.  Feet:     Right foot:     Toenail Condition: Right toenails are normal.     Left foot:     Toenail Condition: Left toenails are normal.  Lymphadenopathy:     Cervical: No cervical adenopathy.     Upper Body:     Right upper body: No supraclavicular adenopathy.     Left upper body: No supraclavicular adenopathy.  Skin:    General: Skin is warm and dry.     Capillary Refill: Capillary refill takes less than 2 seconds.     Findings: Rash present.     Nails: There is no clubbing.     Comments: Flaking rash noted to arms bilaterally with no signs of infection.   Neurological:     General: No focal deficit present.     Mental Status: He is alert and oriented to person, place, and time.     Cranial Nerves: No cranial nerve deficit.     Sensory: Sensation is intact. No sensory deficit.     Motor: Motor function is intact. No weakness.     Coordination: Coordination is intact. Coordination normal.     Gait: Gait is intact. Gait normal.     Comments: Bilateral tingling reported to feet. Sensation intact  Psychiatric:        Attention and Perception: Attention normal.        Mood and Affect: Mood normal.        Speech: Speech normal.        Behavior: Behavior normal. Behavior is cooperative.        Thought Content: Thought content normal.        Cognition and Memory: Cognition and memory normal.        Judgment: Judgment normal.    Results for orders placed or performed during the hospital encounter of 07/12/21  CBC  Result Value Ref Range   WBC 8.5 4.0 - 10.5 K/uL   RBC 4.70 4.22 - 5.81 MIL/uL   Hemoglobin 15.2 13.0 - 17.0 g/dL   HCT 44.2 39.0 - 52.0 %   MCV 94.0 80.0 - 100.0 fL   MCH 32.3 26.0 - 34.0 pg   MCHC 34.4 30.0 - 36.0 g/dL   RDW 13.1 11.5 - 15.5 %   Platelets 265 150 - 400 K/uL   nRBC 0.0 0.0 - 0.2 %  Basic metabolic panel  Result Value Ref Range   Sodium 136 135 - 145 mmol/L   Potassium 4.6 3.5 - 5.1 mmol/L    Chloride 103 98 - 111 mmol/L   CO2 25 22 - 32 mmol/L   Glucose, Bld 114 (H) 70 - 99 mg/dL   BUN 21 (H) 6 - 20 mg/dL   Creatinine, Ser 1.19 0.61 - 1.24 mg/dL   Calcium 8.7 (L)  8.9 - 10.3 mg/dL   GFR, Estimated >60 >60 mL/min   Anion gap 8 5 - 15      Assessment & Plan:   Problem List Items Addressed This Visit     Rash and nonspecific skin eruption    Treatment with nizoral has been effective, however, scattered flaking lesions remain on arms bilaterally. Recommend addition of steroid cream for treatment. Suspect eczema may be underlying given the presentation today.  Patient will f/u if sx worsen or fail to improve.       Relevant Medications   mometasone (ELOCON) 0.1 % ointment   Encounter for annual physical exam - Primary    CPE today.  Declined covid and flu vaccines.  UTD on other HM activities.  Recent labs with cardiology- will follow.       Claudication of both lower extremities (HCC)    Burning and pain present.  Instructions on gabapentin use and how to increase dose if needed provided to patient today.  Recommend trial of capsaicin cream to feet bilaterally at night and during the day to help with symptoms.  Patient will f/u if symptoms worsen or fail to improve.       Hypertension    Blood pressure well controlled today.  No alarm signs present. Recommend continuation of medication.  Patient recently had labs with cardiology therefore we will avoid plicating these labs but will review once these have come back and make changes to plan of care as necessary based on findings.      Peripheral neuropathy    Significant pain associated with bilateral peripheral neuropathy. Patient has been taking gabapentin 2 tabs in the morning only and has not achieved good control. Recommend increasing dose to 200 mg in the morning and at noon and 300 mg at bedtime as well as application of capsaicin cream to see if he can get better control of symptoms. Recommend contacting the  office if he is not having good control with this treatment and we will evaluate further.        Follow up plan: NEXT PREVENTATIVE PHYSICAL DUE IN 1 YEAR. Return for 3-4 months neuropathy, HLD.  LABORATORY TESTING:  Health maintenance labs ordered today, if applicable.  - STI testing: deferred  IMMUNIZATIONS:   - Tdap: Tetanus vaccination status reviewed: last tetanus booster within 10 years. - Influenza: Up to date - Pneumovax: Up to date - Prevnar: Up to date - HPV: Not applicable - Zostavax vaccine: Not applicable  SCREENING: - Colonoscopy: Up to date  Discussed with patient purpose of the colonoscopy is to detect colon cancer at curable precancerous or Vearl Aitken stages  - AAA Screening: Not applicable  - Hearing Test: Not applicable  - Spirometry: Not applicable  - PSA: Not applicable   PATIENT COUNSELING:   For all adult patients, I recommend A well balanced diet low in saturated fats, cholesterol, and moderation in carbohydrates.   This can be as simple as monitoring portion sizes and cutting back on sugary beverages such as soda and juice to start with.    Daily water consumption of at least 64 ounces.  Physical activity at least 180 minutes per week, if just starting out.   This can be as simple as taking the stairs instead of the elevator and walking 2-3 laps around the office  purposefully every day.   STD protection, partner selection, and regular testing if high risk.  Limited consumption of alcoholic beverages if alcohol is consumed.  For women, I  recommend no more than 7 alcoholic beverages per week, spread out throughout the week.  Avoid "binge" drinking or consuming large quantities of alcohol in one setting.   Please let me know if you feel you may need help with reduction or quitting alcohol consumption.   Avoidance of nicotine, if used.  Please let me know if you feel you may need help with reduction or quitting nicotine use.   Daily mental health  attention.  This can be in the form of 5 minute daily meditation, prayer, journaling, yoga, reflection, etc.   Purposeful attention to your emotions and mental state can significantly improve your overall wellbeing  and  Health.  Please know that I am here to help you with all of your health care goals and am happy to work with you to find a solution that works best for you.  The greatest advice I have received with any changes in life are to take it one step at a time, that even means if all you can focus on is the next 60 seconds, then do that and celebrate your victories.  With any changes in life, you will have set backs, and that is OK. The important thing to remember is, if you have a set back, it is not a failure, it is an opportunity to try again!  Health Maintenance Recommendations Screening Testing Mammogram Every 1 -2 years based on history and risk factors Starting at age 52 Pap Smear Ages 21-39 every 3 years Ages 52-65 every 5 years with HPV testing More frequent testing may be required based on results and history Colon Cancer Screening Every 1-10 years based on test performed, risk factors, and history Starting at age 40 Bone Density Screening Every 2-10 years based on history Starting at age 12 for women Recommendations for men differ based on medication usage, history, and risk factors AAA Screening One time ultrasound Men 5-51 years old who have every smoked Lung Cancer Screening Low Dose Lung CT every 12 months Age 27-80 years with a 30 pack-year smoking history who still smoke or who have quit within the last 15 years  Screening Labs Routine  Labs: Complete Blood Count (CBC), Complete Metabolic Panel (CMP), Cholesterol (Lipid Panel) Every 6-12 months based on history and medications May be recommended more frequently based on current conditions or previous results Hemoglobin A1c Lab Every 3-12 months based on history and previous results Starting at age 26 or  earlier with diagnosis of diabetes, high cholesterol, BMI >26, and/or risk factors Frequent monitoring for patients with diabetes to ensure blood sugar control Thyroid Panel (TSH w/ T3 & T4) Every 6 months based on history, symptoms, and risk factors May be repeated more often if on medication HIV One time testing for all patients 19 and older May be repeated more frequently for patients with increased risk factors or exposure Hepatitis C One time testing for all patients 34 and older May be repeated more frequently for patients with increased risk factors or exposure Gonorrhea, Chlamydia Every 12 months for all sexually active persons 13-24 years Additional monitoring may be recommended for those who are considered high risk or who have symptoms PSA Men 61-26 years old with risk factors Additional screening may be recommended from age 23-69 based on risk factors, symptoms, and history  Vaccine Recommendations Tetanus Booster All adults every 10 years Flu Vaccine All patients 6 months and older every year COVID Vaccine All patients 12 years and older Initial dosing with booster May recommend  additional booster based on age and health history HPV Vaccine 2 doses all patients age 75-26 Dosing may be considered for patients over 26 Shingles Vaccine (Shingrix) 2 doses all adults 43 years and older Pneumonia (Pneumovax 60) All adults 76 years and older May recommend earlier dosing based on health history Pneumonia (Prevnar 75) All adults 59 years and older Dosed 1 year after Pneumovax 23  Additional Screening, Testing, and Vaccinations may be recommended on an individualized basis based on family history, health history, risk factors, and/or exposure.

## 2021-08-03 NOTE — Assessment & Plan Note (Signed)
Blood pressure well controlled today.  No alarm signs present. Recommend continuation of medication.  Patient recently had labs with cardiology therefore we will avoid plicating these labs but will review once these have come back and make changes to plan of care as necessary based on findings.

## 2021-08-03 NOTE — Assessment & Plan Note (Signed)
Significant pain associated with bilateral peripheral neuropathy. Patient has been taking gabapentin 2 tabs in the morning only and has not achieved good control. Recommend increasing dose to 200 mg in the morning and at noon and 300 mg at bedtime as well as application of capsaicin cream to see if he can get better control of symptoms. Recommend contacting the office if he is not having good control with this treatment and we will evaluate further.

## 2021-08-05 LAB — BASIC METABOLIC PANEL
BUN/Creatinine Ratio: 20 (ref 9–20)
BUN: 22 mg/dL (ref 6–24)
CO2: 20 mmol/L (ref 20–29)
Calcium: 9.2 mg/dL (ref 8.7–10.2)
Chloride: 104 mmol/L (ref 96–106)
Creatinine, Ser: 1.08 mg/dL (ref 0.76–1.27)
Glucose: 108 mg/dL — ABNORMAL HIGH (ref 70–99)
Potassium: 4.7 mmol/L (ref 3.5–5.2)
Sodium: 139 mmol/L (ref 134–144)
eGFR: 80 mL/min/{1.73_m2} (ref >59)

## 2021-08-06 ENCOUNTER — Telehealth (HOSPITAL_BASED_OUTPATIENT_CLINIC_OR_DEPARTMENT_OTHER): Payer: Self-pay

## 2021-08-06 NOTE — Telephone Encounter (Addendum)
Results called to patient!     ----- Message from Loel Dubonnet, NP sent at 08/06/2021 10:12 AM EST ----- Stable kidney function. Normal electrolytes. Good result! Continue current medications.

## 2021-08-07 DIAGNOSIS — I739 Peripheral vascular disease, unspecified: Secondary | ICD-10-CM | POA: Insufficient documentation

## 2021-08-07 NOTE — Assessment & Plan Note (Signed)
CPE today.  Declined covid and flu vaccines.  UTD on other HM activities.  Recent labs with cardiology- will follow.

## 2021-08-07 NOTE — Assessment & Plan Note (Signed)
Treatment with nizoral has been effective, however, scattered flaking lesions remain on arms bilaterally. Recommend addition of steroid cream for treatment. Suspect eczema may be underlying given the presentation today.  Patient will f/u if sx worsen or fail to improve.

## 2021-08-07 NOTE — Assessment & Plan Note (Signed)
Burning and pain present.  Instructions on gabapentin use and how to increase dose if needed provided to patient today.  Recommend trial of capsaicin cream to feet bilaterally at night and during the day to help with symptoms.  Patient will f/u if symptoms worsen or fail to improve.

## 2021-10-09 NOTE — Progress Notes (Incomplete)
?Cardiology Office Note:   ? ?Date:  10/09/2021  ? ?ID:  Blake Gomez, DOB 1960-11-16, MRN [id number] ? ?PCP:  Blake Render, NP ?  ?[facility] Providers ?Cardiologist:  Skeet Latch, MD { ?Click to update primary MD,subspecialty MD or APP then REFRESH:1}   ? ?Referring MD: Blake Render, NP  ? ?No chief complaint on file. ? ? ?History of Present Illness:   ? ?Blake Gomez is a 61 y.o. male with a hx of combined systolic and diastolic heart failure, hypertension, hyperlipidemia, coronary artery disease s/p stent to ostial-prox LAD (2014), GERD, and obesity here for follow-up. [name] was initially seen in cardiology by Dr. Bettina Gomez 09/2019 where he was doing well from a cardiac perspective. It was recommended to continue DAPT and follow up in 1 year. On 05/22/21 he was seen by Blake Bame, NP for knee surgery clearance and decreased pulses noted by PCP. He also reported DOE, LE edema, and PND. Metoprolol was reduced due to bradycardia. Echo 05/2021 showed LVEF 40-45%. To rule out ICM he had a heart catheterization 06/12/2021 which showed widely patent coronary arteries, ostial-prox LAD stent patient with minimal luminal irregularities in the mid LAD. LVEF was 45-50% with LVEDP 22 mmHg, consistent with combined CHF. He followed up with Blake [place], NP 07/03/2021 and noted becoming fatigued more easily with activity with stable DOE. He also complained of persistent numbness and bilateral LE pain which he hoped would improve with upcoming knee surgery. Farxiga 10 mg daily was started. He underwent left knee arthroscopy with debridement 07/12/2021 due to a left knee medial meniscus tear. ? ?Today, *** ? ? ?Past Medical History:  ?Diagnosis Date  ? Bronchitis, mucopurulent recurrent ([place]) 04/2021  ? CAD (coronary artery disease)   ? stent 2014.  No MI.  Echo normal.  ? Chronic renal insufficiency, stage 2 (mild)   ? GERD (gastroesophageal reflux disease)   ? Hay fever   ? Hematochezia 2017; 2019  ?  Hematochezia/screening: 2017 Colonoscopy normal except adenomatous polyp.  2019 colonoscopy, +polyp, recall 2022.  ? Hemorrhoids 03/21/2016  ? History of adenomatous polyp of colon 2017; 2019  ? Recall 2022  ? History of hematuria   ? per old records  ? Hypercholesterolemia   ? Hypertension   ? Left knee pain 2021  ? MRI 10/2019-> moderate DJD medial mostly, +deg tear of medial and lateral menisci, +effusion, +popliteal cyst->recommended ortho  ? Obesity, Class II, BMI 35-39.9   ? Unilateral primary osteoarthritis, left knee   ? + deg menisc tears: Dr. Marlou Sa, steroid inj 11/2019, pt wants to avoid surgery  ? ? ?Past Surgical History:  ?Procedure Laterality Date  ? CARDIAC CATHETERIZATION  09/2008  ? 95% LAD lesion-->DES  ? CARDIOVASCULAR STRESS TEST  2015  ? cardiolyte->no ischemia.  EF 60%.  ? COLONOSCOPY  2017/2019  ? Hematochezia/screening: 2017 Colonoscopy normal except adenomatous polyp.  2019 colonoscopy, +polyp, recall 2022.  ? CORONARY ANGIOPLASTY WITH STENT PLACEMENT  2010  ? DES to LAD  ? KNEE ARTHROSCOPY Left 07/12/2021  ? Procedure: Left ARTHROSCOPY KNEE / with debridement;  Surgeon: Blake Mulders, MD;  Location: [facility];  Service: Orthopedics;  Laterality: Left;  ? RIGHT/LEFT HEART CATH AND CORONARY ANGIOGRAPHY N/A 06/12/2021  ? Procedure: RIGHT/LEFT HEART CATH AND CORONARY ANGIOGRAPHY;  Surgeon: Blake Crome, MD;  Location: [facility];  Service: Cardiovascular;  Laterality: N/A;  ? ? ?Current Medications: ?No outpatient medications have been marked as taking for the 10/11/21 encounter (Appointment)  with Skeet Latch, MD.  ?  ? ?Allergies:   Patient has no known allergies.  ? ?Social History  ? ?Socioeconomic History  ? Marital status: Married  ?  Spouse name: Not on file  ? Number of children: Not on file  ? Years of education: Not on file  ? Highest education level: Not on file  ?Occupational History  ? Not on file  ?Tobacco Use  ? Smoking status: Never  ? Smokeless tobacco: Never  ?Vaping  Use  ? Vaping Use: Never used  ?Substance and Sexual Activity  ? Alcohol use: Never  ? Drug use: Never  ? Sexual activity: Yes  ?Other Topics Concern  ? Not on file  ?Social History Narrative  ? Married, wife Blake Gomez.  No children.  ? Relocated from [facility] b/c lost job due to covid.  ? 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  ? Lives on [address].  ? No T/A/Ds.  ? ?Social Determinants of Health  ? ?Financial Resource Strain: Not on file  ?Food Insecurity: Not on file  ?Transportation Needs: Not on file  ?Physical Activity: Not on file  ?Stress: Not on file  ?Social Connections: Not on file  ?  ? ?Family History: ?The patient's family history includes Alzheimer's disease in his maternal grandmother; Cancer in his father; Diverticulitis in his mother; Early death in his brother; Heart disease in his mother. ? ?ROS:   ?Please see the history of present illness.    ?All other systems reviewed and are negative. ? ?EKGs/Labs/Other Studies Reviewed:   ? ?The following studies were reviewed today: ? ?Right/Left Heart Cath 06/12/2021: ?CONCLUSIONS: ?Widely patent coronary arteries.  Right dominant anatomy. ?Ostial to proximal LAD stent is widely patent.  Minimal luminal irregularities are noted in the mid LAD. ?Low normal to mildly depressed LVEF at 45 to 50%.  LVEDP 22 mmHg.  Consistent with chronic combined systolic and diastolic heart failure.  Left ventriculography was by hand-injection. ?Normal pulmonary artery pressures.  Mean PA pressure 17 mmHg.  Capillary wedge pressure 7 mmHg. ?  ?RECOMMENDATIONS: ?Guideline directed therapy for combined systolic and diastolic heart failure, especially considering SGLT2 therapy as an add-on to current regimen. ? ?Diagnostic ?Dominance: Right ? ? ?LE Doppler 06/05/2021: ?Summary:  ?Right: Resting right ankle-brachial index is within normal range. No  ?evidence of significant right lower extremity arterial disease. The right  ?toe-brachial index is normal.   ? ?Left: Resting left ankle-brachial index is within normal range. No  ?evidence of significant left lower extremity arterial disease. The left  ?toe-brachial index is normal.  ? ?Echo 06/05/2021: ?Sonographer Comments: Suboptimal subcostal window, suboptimal apical window and patient is morbidly obese. Image acquisition challenging due to patient body habitus and Image acquisition challenging due to respiratory motion.  ?IMPRESSIONS  ? ? 1. Left ventricular ejection fraction, by estimation, is 40 to 45%. The  ?left ventricle has mildly decreased function. The left ventricle  ?demonstrates global hypokinesis. Left ventricular diastolic parameters are  ?consistent with Grade I diastolic  ?dysfunction (impaired relaxation).  ? 2. Right ventricular systolic function is normal. The right ventricular  ?size is mildly enlarged.  ? 3. Left atrial size was mildly dilated.  ? 4. The mitral valve is normal in structure. Mild mitral valve  ?regurgitation. No evidence of mitral stenosis.  ? 5. The aortic valve is normal in structure. Aortic valve regurgitation is  ?mild. No aortic stenosis is present.  ? 6. The inferior vena cava is  normal in size with greater than 50%  ?respiratory variability, suggesting right atrial pressure of 3 mmHg.  ? ?EKG:   EKG is personally reviewed. ?10/11/2021: Sinus ***. Rate *** bpm. ? ?Recent Labs: ?05/03/2021: ALT 23 ?07/12/2021: Hemoglobin 15.2; Platelets 265 ?08/03/2021: BUN 22; Creatinine, Ser 1.08; Potassium 4.7; Sodium 139  ? ?Recent Lipid Panel ?   ?Component Value Date/Time  ? CHOL 205 (H) 05/03/2021 1025  ? TRIG 176 (H) 05/03/2021 1025  ? HDL 34 (L) 05/03/2021 1025  ? CHOLHDL 6.0 (H) 05/03/2021 1025  ? LDLCALC 139 (H) 05/03/2021 1025  ? ? ? ?Risk Assessment/Calculations:   ?{Does this patient have ATRIAL FIBRILLATION?:[phone]} ? ?    ? ?Physical Exam:   ? ?Wt Readings from Last 3 Encounters:  ?08/03/21 279 lb (126.6 kg)  ?07/12/21 270 lb (122.5 kg)  ?07/03/21 281 lb 12.8 oz (127.8 kg)   ?  ? ?VS:  There were no vitals taken for this visit. , BMI There is no height or weight on file to calculate BMI. ?GENERAL:  Well appearing ?HEENT: Pupils equal round and reactive, fundi not visualized, oral mucos

## 2021-10-10 ENCOUNTER — Encounter (HOSPITAL_BASED_OUTPATIENT_CLINIC_OR_DEPARTMENT_OTHER): Payer: Self-pay

## 2021-10-11 ENCOUNTER — Ambulatory Visit (HOSPITAL_BASED_OUTPATIENT_CLINIC_OR_DEPARTMENT_OTHER): Payer: BC Managed Care – PPO | Admitting: Cardiovascular Disease

## 2021-10-12 ENCOUNTER — Encounter (HOSPITAL_BASED_OUTPATIENT_CLINIC_OR_DEPARTMENT_OTHER): Payer: Self-pay | Admitting: Family

## 2021-10-12 ENCOUNTER — Ambulatory Visit (HOSPITAL_BASED_OUTPATIENT_CLINIC_OR_DEPARTMENT_OTHER): Payer: BC Managed Care – PPO | Admitting: Family

## 2021-10-12 VITALS — BP 118/80 | HR 63 | Ht 70.0 in | Wt 285.5 lb

## 2021-10-12 DIAGNOSIS — I25118 Atherosclerotic heart disease of native coronary artery with other forms of angina pectoris: Secondary | ICD-10-CM | POA: Diagnosis not present

## 2021-10-12 DIAGNOSIS — I10 Essential (primary) hypertension: Secondary | ICD-10-CM

## 2021-10-12 DIAGNOSIS — E785 Hyperlipidemia, unspecified: Secondary | ICD-10-CM

## 2021-10-12 DIAGNOSIS — I5042 Chronic combined systolic (congestive) and diastolic (congestive) heart failure: Secondary | ICD-10-CM | POA: Diagnosis not present

## 2021-10-12 MED ORDER — FUROSEMIDE 20 MG PO TABS: 20.0000 mg | ORAL_TABLET | ORAL | 5 refills | Status: DC | PRN

## 2021-10-12 NOTE — Progress Notes (Signed)
? ?Office Visit  ?  ?Patient Name: Blake Gomez ?Date of Encounter: 10/12/2021 ? ?PCP:  Orma Render, NP ?  ?[place]  ?Cardiologist:  Skeet Latch, MD  ?Advanced Practice Provider:  No care team member to display ?Electrophysiologist:  None  ?   ?Chief Complaint  ?  ?Blake Gomez is a 61 y.o. male with a hx of  combined systolic and diastolic heart failure, HTN, HLD,  CAD (stent to ostial-prox LAD in 2014), GERD, obesity presents today for follow up after LHC  ? ?Past Medical History  ?  ?Past Medical History:  ?Diagnosis Date  ? Bronchitis, mucopurulent recurrent ([place]) 04/2021  ? CAD (coronary artery disease)   ? stent 2014.  No MI.  Echo normal.  ? Chronic renal insufficiency, stage 2 (mild)   ? GERD (gastroesophageal reflux disease)   ? Hay fever   ? Hematochezia 2017; 2019  ? Hematochezia/screening: 2017 Colonoscopy normal except adenomatous polyp.  2019 colonoscopy, +polyp, recall 2022.  ? Hemorrhoids 03/21/2016  ? History of adenomatous polyp of colon 2017; 2019  ? Recall 2022  ? History of hematuria   ? per old records  ? Hypercholesterolemia   ? Hypertension   ? Left knee pain 2021  ? MRI 10/2019-> moderate DJD medial mostly, +deg tear of medial and lateral menisci, +effusion, +popliteal cyst->recommended ortho  ? Obesity, Class II, BMI 35-39.9   ? Unilateral primary osteoarthritis, left knee   ? + deg menisc tears: Dr. Marlou Sa, steroid inj 11/2019, pt wants to avoid surgery  ? ?Past Surgical History:  ?Procedure Laterality Date  ? CARDIAC CATHETERIZATION  09/2008  ? 95% LAD lesion-->DES  ? CARDIOVASCULAR STRESS TEST  2015  ? cardiolyte->no ischemia.  EF 60%.  ? COLONOSCOPY  2017/2019  ? Hematochezia/screening: 2017 Colonoscopy normal except adenomatous polyp.  2019 colonoscopy, +polyp, recall 2022.  ? CORONARY ANGIOPLASTY WITH STENT PLACEMENT  2010  ? DES to LAD  ? KNEE ARTHROSCOPY Left 07/12/2021  ? Procedure: Left ARTHROSCOPY KNEE / with debridement;  Surgeon: Vanetta Mulders,  MD;  Location: [facility];  Service: Orthopedics;  Laterality: Left;  ? RIGHT/LEFT HEART CATH AND CORONARY ANGIOGRAPHY N/A 06/12/2021  ? Procedure: RIGHT/LEFT HEART CATH AND CORONARY ANGIOGRAPHY;  Surgeon: Belva Crome, MD;  Location: [facility];  Service: Cardiovascular;  Laterality: N/A;  ? ? ?Allergies ? ?No Known Allergies ? ?History of Present Illness  ?  ?Blake Gomez is a 61 y.o. male with a hx of  combined systolic and diastolic heart failure, HTN, HLD,  CAD (stent to ostial-prox LAD in 2014), GERD, obesity  last seen for [facility] 06/12/21. ? ?Previous stent placement at [facility] in [place], [place]. Seen as new patient 09/2019 by Dr. Bettina Gavia. He was doing well from cardiac perspective. He was recommended to continue DAPT and follow up in 1year.  ? ?Seen 05/22/21 by Christen Bame, NP for clearance for knee surgery as well as decreased pulses noted by PCP. He noted dyspnea on exertion, LE edema, PND. Metoprolol dose was reduced due to bradycardia. He was recommended for ABI which were normal. Echo showed LVEF 40-45%, gr1DD, mildly enlarged RV, mild MR/AI. Subsequent LHC to rule out ICM performed 06/12/21 showed widely patent coronary arteries, ostial-prox LAD stent patient with minimal luminal irregularities in the mid LAD. LVEF 45-50% with LVEDP 5mmHg consistent with combined CHF. He was recommended for optimization of heart failure therapy.  ? ?He was seen 07/03/21 and clearance provided for knee  surgery. Farxiga added for optimization of HF therapy. Spironolactone was able to be increased to 25mg  QD via phone after follow up labs remained stable.  ? ?He presents today for follow up with his mother. Weight up today about 4 pounds - not weighing routinely at home. Reports no shortness of breath and only dyspnea on exertion with activity such as climbing large flight of stairs. Reports no chest pain, pressure, or tightness. No  orthopnea, PND. Notes pedal edema at end of work day which resolves with  elevation. He is very active working in [place]. Reports no palpitations.  Concerns about cost of [place] and patient assistance completed in clinic.  ? ?EKGs/Labs/Other Studies Reviewed:  ? ?The following studies were reviewed today: ? ?LHC 06/12/21 ?  ?CONCLUSIONS: ?Widely patent coronary arteries.  Right dominant anatomy. ?Ostial to proximal LAD stent is widely patent.  Minimal luminal irregularities are noted in the mid LAD. ?Low normal to mildly depressed LVEF at 45 to 50%.  LVEDP 22 mmHg.  Consistent with chronic combined systolic and diastolic heart failure.  Left ventriculography was by hand-injection. ?Normal pulmonary artery pressures.  Mean PA pressure 17 mmHg.  Capillary wedge pressure 7 mmHg. ?  ?RECOMMENDATIONS: ?  ?Guideline directed therapy for combined systolic and diastolic heart failure, especially considering SGLT2 therapy as an add-on to current regimen. ? ? ?Coronary Findings ? ?Diagnostic ?Dominance: Right ?Left Anterior Descending  ?Vessel is small.  ?Ost LAD to Mid LAD lesion is 15% stenosed. The lesion was previously treated .  ?Mid LAD lesion is 25% stenosed.  ?  ?Second Diagonal Branch  ?2nd Diag lesion is 20% stenosed.  ?  ? ?Right Heart ? ?Right Heart Pressures Hemodynamic findings consistent with pulmonary hypertension. Elevated LV EDP consistent with volume overload.  ? ? ?Left Heart ? ?Left Ventricle The left ventricular size is normal. There is mild left ventricular systolic dysfunction. LV end diastolic pressure is mildly elevated. The left ventricular ejection fraction is 45-50% by visual estimate. There are LV function abnormalities due to global hypokinesis.  ? ?Dominance: Right ? ? ?LE ABI 06/05/21 ? ?Summary:  ?Right: Resting right ankle-brachial index is within normal range. No  ?evidence of significant right lower extremity arterial disease. The right  ?toe-brachial index is normal.  ? ?Left: Resting left ankle-brachial index is within normal range. No  ?evidence of  significant left lower extremity arterial disease. The left  ?toe-brachial index is normal.  ? ?Echo 06/05/21 ? ? 1. Left ventricular ejection fraction, by estimation, is 40 to 45%. The  ?left ventricle has mildly decreased function. The left ventricle  ?demonstrates global hypokinesis. Left ventricular diastolic parameters are  ?consistent with Grade I diastolic  ?dysfunction (impaired relaxation).  ? 2. Right ventricular systolic function is normal. The right ventricular  ?size is mildly enlarged.  ? 3. Left atrial size was mildly dilated.  ? 4. The mitral valve is normal in structure. Mild mitral valve  ?regurgitation. No evidence of mitral stenosis.  ? 5. The aortic valve is normal in structure. Aortic valve regurgitation is  ?mild. No aortic stenosis is present.  ? 6. The inferior vena cava is normal in size with greater than 50%  ?respiratory variability, suggesting right atrial pressure of 3 mmHg.  ? ?EKG:  No EKG today. ? ?Recent Labs: ?05/03/2021: ALT 23 ?07/12/2021: Hemoglobin 15.2; Platelets 265 ?08/03/2021: BUN 22; Creatinine, Ser 1.08; Potassium 4.7; Sodium 139  ?Recent Lipid Panel ?   ?Component Value Date/Time  ? CHOL 205 (H) 05/03/2021  1025  ? TRIG 176 (H) 05/03/2021 1025  ? HDL 34 (L) 05/03/2021 1025  ? CHOLHDL 6.0 (H) 05/03/2021 1025  ? LDLCALC 139 (H) 05/03/2021 1025  ? ? ?Home Medications  ? ?Current Meds  ?Medication Sig  ? AMBULATORY NON FORMULARY MEDICATION Upper arm blood pressure monitor as covered by insurance- electronic. For ICD-10  I10  ? aspirin 81 MG EC tablet Take 1 tablet (81 mg total) by mouth daily. Swallow whole.  ? atorvastatin (LIPITOR) 80 MG tablet Take once a day for cholesterol  ? clopidogrel (PLAVIX) 75 MG tablet Take one tablet a day for artery disease  ? dapagliflozin propanediol (FARXIGA) 10 MG TABS tablet Take 1 tablet (10 mg total) by mouth daily before breakfast.  ? Fish Oil-Krill Oil (MEGARED ADVANCED 4 IN 1 PO) Take by mouth daily.  ? folic acid (FOLVITE) Q000111Q MCG  tablet folic acid Q000111Q mcg tablet ? Take 1 tablet every day by oral route.  ? furosemide (LASIX) 20 MG tablet Take 1 tablet (20 mg total) by mouth as needed for fluid or edema.  ? gabapentin (NEURONTIN) 100 MG capsule T

## 2021-10-12 NOTE — Patient Instructions (Addendum)
Medication Instructions:  ?Your physician has recommended you make the following change in your medication:  ? ?START Furosemide (Lasix) 20mg  as needed for weight gain of 2 pounds overnight or 5 pounds in one week ?*Please take for 2 days then take as needed ? ?*If you need a refill on your cardiac medications before your next appointment, please call your pharmacy* ? ? ?Lab Work: ?Your physician recommends that you return for lab work today: CMP, lipid panel ? ?If you have labs (blood work) drawn today and your tests are completely normal, you will receive your results only by: ?MyChart Message (if you have MyChart) OR ?A paper copy in the mail ?If you have any lab test that is abnormal or we need to change your treatment, we will call you to review the results. ? ? ?Testing/Procedures: ?Your physician has requested that you have an echocardiogram in 2-3 months. Echocardiography is a painless test that uses sound waves to create images of your heart. It provides your doctor with information about the size and shape of your heart and how well your heart?s chambers and valves are working. This procedure takes approximately one hour. There are no restrictions for this procedure.  ? ? ?Follow-Up: ?At [facility], you and your health needs are our priority.  As part of our continuing mission to provide you with exceptional heart care, we have created designated Provider Care Teams.  These Care Teams include your primary Cardiologist (physician) and Advanced Practice Providers (APPs -  Physician Assistants and Nurse Practitioners) who all work together to provide you with the care you need, when you need it. ? ?We recommend signing up for the patient portal called "MyChart".  Sign up information is provided on this After Visit Summary.  MyChart is used to connect with patients for Virtual Visits (Telemedicine).  Patients are able to view lab/test results, encounter notes, upcoming appointments, etc.  Non-urgent  messages can be sent to your provider as well.   ?To learn more about what you can do with MyChart, go to [email or web address].   ? ?Your next appointment:   ?After echocardiogram with Dr. Oval Linsey or Loel Dubonnet, NP  ? ? ?Other Instructions ? ?Recommend weighing daily and keeping a log. Please call our office if you have weight gain of 2 pounds overnight or 5 pounds in 1 week.  ? ?Date ? Time Weight  ? ?    ? ?    ? ?    ? ?    ? ?    ? ?    ? ?    ? ?    ?  ?Heart Healthy Diet Recommendations: ?A low-salt diet is recommended. Meats should be grilled, baked, or boiled. Avoid fried foods. Focus on lean protein sources like fish or chicken with vegetables and fruits. The American Heart Association is a Microbiologist!  American Heart Association Diet and Lifeystyle Recommendations  ? ?Exercise recommendations: ?The American Heart Association recommends 150 minutes of moderate intensity exercise weekly. ?Try 30 minutes of moderate intensity exercise 4-5 times per week. ?This could include walking, jogging, or swimming. ? ? ?Important Information About Sugar ? ? ? ? ?  ?

## 2021-10-13 LAB — COMPREHENSIVE METABOLIC PANEL
ALT: 23 IU/L (ref 0–44)
AST: 27 IU/L (ref 0–40)
Albumin/Globulin Ratio: 1.6 (ref 1.2–2.2)
Albumin: 4 g/dL (ref 3.8–4.9)
Alkaline Phosphatase: 149 IU/L — ABNORMAL HIGH (ref 44–121)
BUN/Creatinine Ratio: 15 (ref 9–20)
BUN: 16 mg/dL (ref 6–24)
Bilirubin Total: 0.4 mg/dL (ref 0.0–1.2)
CO2: 23 mmol/L (ref 20–29)
Calcium: 8.8 mg/dL (ref 8.7–10.2)
Chloride: 105 mmol/L (ref 96–106)
Creatinine, Ser: 1.07 mg/dL (ref 0.76–1.27)
Globulin, Total: 2.5 g/dL (ref 1.5–4.5)
Glucose: 106 mg/dL — ABNORMAL HIGH (ref 70–99)
Potassium: 4.8 mmol/L (ref 3.5–5.2)
Sodium: 140 mmol/L (ref 134–144)
Total Protein: 6.5 g/dL (ref 6.0–8.5)
eGFR: 80 mL/min/{1.73_m2} (ref >59)

## 2021-10-13 LAB — LIPID PANEL
Chol/HDL Ratio: 3.9 ratio (ref 0.0–5.0)
Cholesterol, Total: 129 mg/dL (ref 100–199)
HDL: 33 mg/dL — ABNORMAL LOW (ref >39)
LDL Chol Calc (NIH): 79 mg/dL (ref 0–99)
Triglycerides: 88 mg/dL (ref 0–149)
VLDL Cholesterol Cal: 17 mg/dL (ref 5–40)

## 2021-10-15 ENCOUNTER — Telehealth (HOSPITAL_BASED_OUTPATIENT_CLINIC_OR_DEPARTMENT_OTHER): Payer: Self-pay

## 2021-10-15 ENCOUNTER — Encounter (HOSPITAL_BASED_OUTPATIENT_CLINIC_OR_DEPARTMENT_OTHER): Payer: Self-pay

## 2021-10-15 DIAGNOSIS — E785 Hyperlipidemia, unspecified: Secondary | ICD-10-CM

## 2021-10-15 NOTE — Telephone Encounter (Addendum)
Left message for patient to call back ? ? ? ? ?----- Message from Alver Sorrow, NP sent at 10/14/2021  4:51 PM EDT ----- ?Normal kidneys, electrolytes. Normal liver enzymes though alkaline phosphatase mildly elevated - reduce intake fried foods, alcohol, sugars. Cholesterol panel much improved! LDL (bad cholesterol) went from 139 to 79. Not yet to goal of less than 70. Recommend addition of Zetia 10mg  daily with repeat FLP/LFT in 2 months. ?

## 2021-10-16 MED ORDER — EZETIMIBE 10 MG PO TABS: 10.0000 mg | ORAL_TABLET | Freq: Every day | ORAL | 3 refills | Status: DC

## 2021-10-31 ENCOUNTER — Encounter (HOSPITAL_BASED_OUTPATIENT_CLINIC_OR_DEPARTMENT_OTHER): Payer: Self-pay | Admitting: Nurse Practitioner

## 2021-10-31 ENCOUNTER — Ambulatory Visit (HOSPITAL_BASED_OUTPATIENT_CLINIC_OR_DEPARTMENT_OTHER): Payer: BC Managed Care – PPO | Admitting: Nurse Practitioner

## 2021-10-31 VITALS — BP 122/72 | HR 63 | Ht 70.0 in | Wt 281.0 lb

## 2021-10-31 DIAGNOSIS — R7309 Other abnormal glucose: Secondary | ICD-10-CM | POA: Diagnosis not present

## 2021-10-31 DIAGNOSIS — N179 Acute kidney failure, unspecified: Secondary | ICD-10-CM | POA: Diagnosis not present

## 2021-10-31 DIAGNOSIS — G6289 Other specified polyneuropathies: Secondary | ICD-10-CM

## 2021-10-31 DIAGNOSIS — Z1211 Encounter for screening for malignant neoplasm of colon: Secondary | ICD-10-CM

## 2021-10-31 DIAGNOSIS — I249 Acute ischemic heart disease, unspecified: Secondary | ICD-10-CM | POA: Diagnosis not present

## 2021-10-31 DIAGNOSIS — I5022 Chronic systolic (congestive) heart failure: Secondary | ICD-10-CM

## 2021-10-31 DIAGNOSIS — I10 Essential (primary) hypertension: Secondary | ICD-10-CM | POA: Diagnosis not present

## 2021-10-31 DIAGNOSIS — R748 Abnormal levels of other serum enzymes: Secondary | ICD-10-CM | POA: Diagnosis not present

## 2021-10-31 DIAGNOSIS — I739 Peripheral vascular disease, unspecified: Secondary | ICD-10-CM

## 2021-10-31 DIAGNOSIS — R202 Paresthesia of skin: Secondary | ICD-10-CM | POA: Diagnosis not present

## 2021-10-31 DIAGNOSIS — R21 Rash and other nonspecific skin eruption: Secondary | ICD-10-CM

## 2021-10-31 DIAGNOSIS — E782 Mixed hyperlipidemia: Secondary | ICD-10-CM

## 2021-10-31 NOTE — Assessment & Plan Note (Signed)
Resolution of pain with use of gabapentin.  Pedal pulses intact color and sensation are appropriate today. 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  No evidence of vascular abnormalities present but in the setting of coronary artery disease will continue to monitor and manage closely.  His blood pressure is under control at this time and he has recently been started on Zetia for his cholesterol control.  He is managing his medications well and following appropriate dietary recommendations. ?Recommend follow-up if new or worsening symptoms present. ?

## 2021-10-31 NOTE — Progress Notes (Signed)
?Blake Keeler, DNP, AGNP-c ?[facility] ?[address] ?[address] ?[place], [place] [address] ?[phone] Office [phone] Fax ? ?ESTABLISHED PATIENT- Chronic Health and/or Follow-Up Visit ? ?Blood pressure 122/72, pulse 63, height 5' 10"  (1.778 m), weight 281 lb (127.5 kg), SpO2 96 %. ? ?Hypertension and Hyperlipidemia ? ? ?HPI ? ?Blake Gomez  is a 61 y.o. year old male presenting today for evaluation and management of the following: ?Neuropathy ?Not painful, but numb ?When working all day his feet are swollen ?Taking lasix as needed, when taking he is up 4 times a night to urinate ?No color changes to feet or legs, they do not feel cold ?HTN ?No chest pain, shortness of breath, palpitations, headaches, vision changes, dizziness ?Blood pressure well controlled at this time ?Taking Lasix as needed for edema ?HLD ?Recently started on new medication with cardiology ?Tolerating well with no side effects ?Returns to cardiology in July for repeat evaluation and labs ?CAD ?Up urinating a lot through the night even when not taking lasix ?LE edema typically occurring at the end of the workday ?The edema does go back down by morning.  ?ARF ?Recent labs with cardiology show stable kidney function ?Does have increased urination during the night no hesitancy, difficulty starting or weak stream ?No blood in urine ?On Farxiga ?Labs ?Recent labs showed elevated alk phos and elevated glucose levels ? ?ROS ?All ROS negative with exception of what is listed in HPI ? ?PHYSICAL EXAM ?Physical Exam ?Vitals and nursing note reviewed.  ?Constitutional:   ?   Appearance: Normal appearance. He is obese.  ?HENT:  ?   Head: Normocephalic.  ?Eyes:  ?   Extraocular Movements: Extraocular movements intact.  ?   Conjunctiva/sclera: Conjunctivae normal.  ?   Pupils: Pupils are equal, round, and reactive to light.  ?Neck:  ?   Vascular: No carotid bruit.  ?Cardiovascular:  ?   Rate and Rhythm: Normal rate  and regular rhythm.  ?   Pulses: Normal pulses.  ?   Heart sounds: Normal heart sounds. No murmur heard. ?Pulmonary:  ?   Effort: Pulmonary effort is normal.  ?   Breath sounds: Normal breath sounds. No wheezing.  ?Abdominal:  ?   General: Bowel sounds are normal. There is no distension.  ?   Palpations: Abdomen is soft.  ?   Tenderness: There is no abdominal tenderness. There is no guarding.  ?Musculoskeletal:     ?   General: Tenderness present.  ?   Cervical back: Normal range of motion.  ?   Right lower leg: No edema.  ?   Left lower leg: No edema.  ?   Comments: Bilateral tenderness to feet- sensation intact, gait intact, coloration normal, capillary refill normal, pulses normal.   ?Lymphadenopathy:  ?   Cervical: No cervical adenopathy.  ?Skin: ?   General: Skin is warm and dry.  ?   Capillary Refill: Capillary refill takes less than 2 seconds.  ?Neurological:  ?   General: No focal deficit present.  ?   Mental Status: He is alert.  ?   Sensory: No sensory deficit.  ?   Motor: No weakness.  ?   Coordination: Coordination normal.  ?   Gait: Gait normal.  ?Psychiatric:     ?   Mood and Affect: Mood normal.     ?   Behavior: Behavior normal.     ?   Thought Content: Thought content normal.     ?  Judgment: Judgment normal.  ? ? ?ASSESSMENT & PLAN ?Problem List Items Addressed This Visit   ? ? Mixed hyperlipidemia  ?  Chronic in the setting of hypertension and coronary artery disease.  He is following closely with cardiology.  Recently started on Zetia for improved control.  LDL goal is less than 70. ?He is monitoring his diet and working to reduce his saturated fats. ?Plans for follow-up labs with cardiology in July of this year.  We will follow.  We will continue to collaborate care closely. ? ?  ?  ? Relevant Orders  ? PSA Total (Reflex To Free) (Completed)  ? Acute renal failure ([place])  ?  No acute renal failure present on recent labs.  His kidney function has stabilized with the use of [place]. ?At this time  recommend continuation of Farxiga and monitoring for urinary symptoms.  We will obtain PSA today as he is urinating multiple times during the night however I do suspect this is related to heart failure component as opposed to prostate.  Blood pressure is well controlled at this time. ?Plan to monitor kidneys closely with multiple medications and follow-up if new or worsening symptoms present. ? ?  ?  ? Relevant Orders  ? PSA Total (Reflex To Free) (Completed)  ? Acute coronary syndrome (HCC) - Primary  ?  Chronic.  With history of CAD.  Recent labs with cardiology reviewed with patient today. ?Appears stable at this time with no alarm symptoms present. ?Recommend continuation with medication and follow-up closely with cardiology for evaluation. ?He does have an echocardiogram in the near future with cardiology.  We will follow along. ?Continue to collaborate care closely with cardiology for management. ? ?  ?  ? Hypertension  ?  Chronic.  Stable.  No changes in medications today. ?Labs reviewed today with cardiology.  Will obtain additional labs today. ?No alarm symptoms present at this time. ?Recommend continuation with medication and close monitoring.  We will continue to collaborate care with cardiology. ? ?  ?  ? Relevant Orders  ? CBC with Differential/Platelet (Completed)  ? PSA Total (Reflex To Free) (Completed)  ? Chronic systolic heart failure ([place])  ?  Chronic.  Echocardiogram in the near future with cardiology. ?Lipids are well controlled as well as blood pressure. ?No alarm symptoms present today. ?He is having some lower extremity edema that is worse at night however this does improve by morning.  He is up several times during the night to urinate which could be from daytime fluid retention.  We will monitor this closely. ?Pedal pulses intact today with no signs of skin breakdown or vascular dysfunction. ?We will continue to collaborate care with cardiology and monitor closely. ? ?  ?  ? Peripheral  neuropathy  ?  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 ?Recent labs with cardiology did show elevation in alkaline phosphatase.  We will monitor vitamin D levels today.  Pain is well controlled with gabapentin however numbness is still present.  We will also obtain additional A1c for monitoring as well as B12. ?No alarm symptoms present.  Pedal pulses intact with appropriate coloration and temperature. ?We will continue to monitor closely. ? ?  ?  ? Claudication of both lower extremities ([place])  ?  Resolution of pain with use of gabapentin.  Pedal pulses intact color and sensation are appropriate today. 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  No evidence of vascular abnormalities present but in the  setting of coronary artery disease will continue to monitor and manage closely.  His blood pressure is under control at this time and he has recently been started on Zetia for his cholesterol control.  He is managing his medications well and following appropriate dietary recommendations. ?Recommend follow-up if new or worsening symptoms present. ? ?  ?  ? ?Other Visit Diagnoses   ? ? Elevated alkaline phosphatase level      ? Relevant Orders  ? VITAMIN D 25 Hydroxy (Vit-D Deficiency, Fractures) (Completed)  ? Elevated glucose level      ? Relevant Orders  ? Hemoglobin A1c (Completed)  ? Screening for colon cancer      ? Relevant Orders  ? Ambulatory referral to Gastroenterology  ? Paresthesia of bilateral legs      ? Relevant Orders  ? VITAMIN D 25 Hydroxy (Vit-D Deficiency, Fractures) (Completed)  ? B12 and Folate Panel (Completed)  ? Skin rash      ? ?  ? ? ? ?FOLLOW-UP ?Return in about 6 months (around 05/03/2022) for HLD, HTN, Neuropathy. ? ? ?Blake Keeler, DNP, AGNP-c ?

## 2021-10-31 NOTE — Assessment & Plan Note (Signed)
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 ?Recent labs with cardiology did show elevation in alkaline phosphatase.  We will monitor vitamin D levels today.  Pain is well controlled with gabapentin however numbness is still present.  We will also obtain additional A1c for monitoring as well as B12. ?No alarm symptoms present.  Pedal pulses intact with appropriate coloration and temperature. ?We will continue to monitor closely. ?

## 2021-10-31 NOTE — Assessment & Plan Note (Signed)
Chronic.  With history of CAD.  Recent labs with cardiology reviewed with patient today. ?Appears stable at this time with no alarm symptoms present. ?Recommend continuation with medication and follow-up closely with cardiology for evaluation. ?He does have an echocardiogram in the near future with cardiology.  We will follow along. ?Continue to collaborate care closely with cardiology for management. ?

## 2021-10-31 NOTE — Assessment & Plan Note (Signed)
Chronic.  Stable.  No changes in medications today. ?Labs reviewed today with cardiology.  Will obtain additional labs today. ?No alarm symptoms present at this time. ?Recommend continuation with medication and close monitoring.  We will continue to collaborate care with cardiology. ?

## 2021-10-31 NOTE — Patient Instructions (Signed)
It was a pleasure seeing you today. I hope your time spent with [place] was pleasant and helpful. Please let us know if there is anything we can do to improve the service you receive.  ? ?[place],  ? ?I have reviewed your labs from cardiology. I will not repeat these today. I will keep watching along with what Caitlyn is doing.  ? ?I will check your Vitamin D and B12 levels today to make sure that these are not off and contributing to the numbness on your feet. I am also going to check your sugar levels. I will add a PSA on to make sure the prostate looks ok.  ? ?The following orders have been placed for you today: ? ?Orders Placed This Encounter  ?Procedures  ? CBC with Differential/Platelet  ?  Order Specific Question:   Release to patient  ?  Answer:   Immediate  ? Hemoglobin A1c  ?  Order Specific Question:   Release to patient  ?  Answer:   Immediate  ? VITAMIN D 25 Hydroxy (Vit-D Deficiency, Fractures)  ?  Order Specific Question:   Release to patient  ?  Answer:   Immediate  ? B12 and Folate Panel  ? PSA Total (Reflex To Free)  ? Ambulatory referral to Gastroenterology  ?  Referral Priority:   Routine  ?  Referral Type:   Consultation  ?  Referral Reason:   Specialty Services Required  ?  Number of Visits Requested:   1  ? ? ? ?Important Office Information ?Lab Results ?If labs were ordered, please note that you will see results through [place] as soon as they come available from [facility].  ?It takes up to 5 business days for the results to be routed to me and for me to review them once all of the lab results have come through from [facility]. I will make recommendations based on your results and send these through [facility] or someone from the office will call you to discuss. If your labs are abnormal, we may contact you to schedule a visit to discuss the results and make recommendations.  ?If you have not heard from [place] within 5 business days or you have waited longer than a week and your lab results have not come  through on [place], please feel free to call the office or send a message through [place] to follow-up on these labs.  ? ?Referrals ?If referrals were placed today, the office where the referral was sent will contact you either by phone or through [place] to set up scheduling. Please note that it can take up to a week for the referral office to contact you. If you do not hear from them in a week, please contact the referral office directly to inquire about scheduling.  ? ?Condition Treated ?If your condition worsens or you begin to have new symptoms, please schedule a follow-up appointment for further evaluation. If you are not sure if an appointment is needed, you may call the office to leave a message for the nurse and someone will contact you with recommendations.  ?If you have an urgent or life threatening emergency, please do not call the office, but seek emergency evaluation by calling 911 or going to the nearest emergency room for evaluation.  ? ?MyChart and Phone Calls ?Please do not use MyChart for urgent messages. It may take up to 3 business days for MyChart messages to be read by staff and if they are unable to handle the  request, an additional 3 business days for them to be routed to me and for my response.  ?Messages sent to the provider through [place] do not come directly to the provider, please allow time for these messages to be routed and for me to respond.  ?We get a large volume of MyChart messages daily and these are responded to in the order received.  ? ?For urgent messages, please call the office at [phone] and speak with the front office staff or leave a message on the line of my assistant for guidance.  ?We are seeing patients from the hours of 8:00 am through 5:00 pm and calls directly to the nurse may not be answered immediately due to seeing patients, but your call will be returned as soon as possible.  ?Phone  messages received after 4:00 PM Monday through Thursday may not be  returned until the following business day. Phone messages received after 11:00 AM on Friday may not be returned until Monday.  ? ?After Hours ?We share on call hours with providers from other offices. If you have an urgent need after hours that cannot wait until the next business day, please contact the on call provider by calling the office number. A nurse will speak with you and contact the provider if needed for recommendations.  ?If you have an urgent or life threatening emergency after hours, please do not call the on call provider, but seek emergency evaluation by calling 911 or going to the nearest emergency room for evaluation.  ? ?Paperwork ?All paperwork requires a minimum of 5 days to complete and return to you or the designated personnel. Please keep this in mind when bringing in forms or sending requests for paperwork completion to the office.  ?  ?

## 2021-10-31 NOTE — Assessment & Plan Note (Signed)
Chronic.  Echocardiogram in the near future with cardiology. ?Lipids are well controlled as well as blood pressure. ?No alarm symptoms present today. ?He is having some lower extremity edema that is worse at night however this does improve by morning.  He is up several times during the night to urinate which could be from daytime fluid retention.  We will monitor this closely. ?Pedal pulses intact today with no signs of skin breakdown or vascular dysfunction. ?We will continue to collaborate care with cardiology and monitor closely. ?

## 2021-10-31 NOTE — Assessment & Plan Note (Signed)
Chronic in the setting of hypertension and coronary artery disease.  He is following closely with cardiology.  Recently started on Zetia for improved control.  LDL goal is less than 70. ?He is monitoring his diet and working to reduce his saturated fats. ?Plans for follow-up labs with cardiology in July of this year.  We will follow.  We will continue to collaborate care closely. ?

## 2021-10-31 NOTE — Assessment & Plan Note (Addendum)
No acute renal failure present on recent labs.  His kidney function has stabilized with the use of [place]. ?At this time recommend continuation of Farxiga and monitoring for urinary symptoms.  We will obtain PSA today as he is urinating multiple times during the night however I do suspect this is related to heart failure component as opposed to prostate.  Blood pressure is well controlled at this time. ?Plan to monitor kidneys closely with multiple medications and follow-up if new or worsening symptoms present. ?

## 2021-11-01 LAB — CBC WITH DIFFERENTIAL/PLATELET
Basophils Absolute: 0.1 10*3/uL (ref 0.0–0.2)
Basos: 1 %
EOS (ABSOLUTE): 0.2 10*3/uL (ref 0.0–0.4)
Eos: 2 %
Hematocrit: 48 % (ref 37.5–51.0)
Hemoglobin: 16.4 g/dL (ref 13.0–17.7)
Immature Grans (Abs): 0 10*3/uL (ref 0.0–0.1)
Immature Granulocytes: 0 %
Lymphocytes Absolute: 1.6 10*3/uL (ref 0.7–3.1)
Lymphs: 20 %
MCH: 32.2 pg (ref 26.6–33.0)
MCHC: 34.2 g/dL (ref 31.5–35.7)
MCV: 94 fL (ref 79–97)
Monocytes Absolute: 0.5 10*3/uL (ref 0.1–0.9)
Monocytes: 7 %
Neutrophils Absolute: 5.7 10*3/uL (ref 1.4–7.0)
Neutrophils: 70 %
Platelets: 284 10*3/uL (ref 150–450)
RBC: 5.1 x10E6/uL (ref 4.14–5.80)
RDW: 13 % (ref 11.6–15.4)
WBC: 8.1 10*3/uL (ref 3.4–10.8)

## 2021-11-01 LAB — HEMOGLOBIN A1C
Est. average glucose Bld gHb Est-mCnc: 111 mg/dL
Hgb A1c MFr Bld: 5.5 % (ref 4.8–5.6)

## 2021-11-01 LAB — VITAMIN D 25 HYDROXY (VIT D DEFICIENCY, FRACTURES): Vit D, 25-Hydroxy: 31.8 ng/mL (ref 30.0–100.0)

## 2021-11-01 LAB — PSA TOTAL (REFLEX TO FREE): Prostate Specific Ag, Serum: 0.4 ng/mL (ref 0.0–4.0)

## 2021-11-01 LAB — B12 AND FOLATE PANEL
Folate: >20 ng/mL (ref >3.0)
Vitamin B-12: 328 pg/mL (ref 232–1245)

## 2021-11-12 ENCOUNTER — Encounter (HOSPITAL_BASED_OUTPATIENT_CLINIC_OR_DEPARTMENT_OTHER): Payer: Self-pay | Admitting: Nurse Practitioner

## 2021-11-12 ENCOUNTER — Ambulatory Visit (HOSPITAL_BASED_OUTPATIENT_CLINIC_OR_DEPARTMENT_OTHER): Payer: BC Managed Care – PPO | Admitting: Nurse Practitioner

## 2021-11-12 VITALS — BP 127/77 | HR 62 | Ht 70.0 in | Wt 282.0 lb

## 2021-11-12 DIAGNOSIS — M545 Low back pain, unspecified: Secondary | ICD-10-CM | POA: Insufficient documentation

## 2021-11-12 MED ORDER — MELOXICAM 15 MG PO TABS: 15.0000 mg | ORAL_TABLET | Freq: Every day | ORAL | 0 refills | Status: DC

## 2021-11-12 MED ORDER — TRAMADOL HCL 50 MG PO TABS: 50.0000 mg | ORAL_TABLET | Freq: Three times a day (TID) | ORAL | 0 refills | Status: AC | PRN

## 2021-11-12 MED ORDER — DEXAMETHASONE SODIUM PHOSPHATE 10 MG/ML IJ SOLN: 10.0000 mg | Freq: Once | INTRAMUSCULAR | Status: DC

## 2021-11-12 MED ORDER — KETOROLAC TROMETHAMINE 60 MG/2ML IM SOLN: 60.0000 mg | Freq: Once | INTRAMUSCULAR | Status: AC

## 2021-11-12 MED ORDER — CYCLOBENZAPRINE HCL 10 MG PO TABS: 10.0000 mg | ORAL_TABLET | Freq: Three times a day (TID) | ORAL | 0 refills | Status: AC | PRN

## 2021-11-12 NOTE — Assessment & Plan Note (Signed)
Suspect strain of lumbar musculature to the left side.  Most likely related to chronic bending and lifting associated with work.  Suspect recent prolonged time on hands and knees contributed to the onset.  Recommend ice, heat, gentle stretching exercises, and NSAIDs to help with pain management.  We will also send short-term dose of tramadol to allow patient to function.  Flexeril provided for muscle spasms.  Encourage patient to rest for the next 2 days and then slowly begin gentle exercises.  No alarm symptoms present today.  We will plan to continue to monitor closely.  If symptoms worsen or fail to improve patient will notify the office.  Physical therapy services may be needed as well as imaging if symptoms do not improve.

## 2021-11-12 NOTE — Progress Notes (Signed)
Tollie Eth, DNP, AGNP-c [facility] [address]  [address] [place], [place] [address] [phone]  Subjective:   Blake Gomez is a 61 y.o. 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   ROS negative except for what is listed in HPI. Objective:  BP 127/77   Pulse 62   Ht 5\' 10"  (1.778 m)   Wt 282 lb (127.9 kg)   SpO2 98%   BMI 40.46 kg/m  Physical Exam Vitals and nursing note reviewed.  Constitutional:      Appearance: He is ill-appearing.  HENT:     Head: Normocephalic.  Musculoskeletal:        General: Tenderness present. No swelling.     Cervical back: Normal range of motion and neck supple. No rigidity or tenderness.     Lumbar back: Spasms and tenderness present. No bony tenderness. Decreased range of motion.     Right lower leg: No edema.     Left lower leg: No edema.     Comments: Decreased extension and flexion of the lumbar spine, lateral extension, and lateral rotation both to the left and the right.  No ecchymosis, erythema, edema noted.  Spasms of the musculature noted to the left lumbar region. Unable to  perform SLR due to pain  Skin:    General: Skin is warm and dry.     Capillary Refill: Capillary refill takes less than 2 seconds.  Neurological:     General: No focal deficit present.     Mental Status: He is alert and oriented to person, place, and time.     Cranial Nerves: No cranial nerve deficit.     Sensory: No sensory deficit.     Motor: Weakness present.     Coordination: Coordination normal.     Gait: Gait abnormal.     Deep Tendon Reflexes: Reflexes normal.  Psychiatric:        Mood and Affect: Mood normal.        Behavior: Behavior normal.        Thought Content: Thought content normal.        Judgment: Judgment normal.          Assessment & Plan:   Problem List Items Addressed This Visit     Acute left-sided low back pain without sciatica - Primary    Suspect strain of lumbar musculature to the left side.  Most likely related to chronic bending and lifting associated with work.  Suspect recent prolonged time on hands and knees contributed to the onset.  Recommend ice, heat, gentle stretching  exercises, and NSAIDs to help with pain management.  We will also send short-term dose of tramadol to allow patient to function.  Flexeril provided for muscle spasms.  Encourage patient to rest for the next 2 days and then slowly begin gentle exercises.  No alarm symptoms present today.  We will plan to continue to monitor closely.  If symptoms worsen or fail to improve patient will notify the office.  Physical therapy services may be needed as well as imaging if symptoms do not improve.       Relevant Medications   cyclobenzaprine (FLEXERIL) 10 MG tablet   dexamethasone (DECADRON) injection 10 mg   meloxicam (MOBIC) 15 MG tablet   traMADol (ULTRAM) 50 MG tablet   ketorolac (TORADOL) injection 60 mg     Tollie Eth, DNP, AGNP-c 11/12/2021  8:47 PM

## 2021-11-12 NOTE — Patient Instructions (Signed)
I want you to rest your back and use heat to the back for 20 minutes at a time a few times a day. I also want you to put ice on the back for 20 minutes a few times a day.   Start with the stretches on the back of this form and be gentle with yourself- dont push too hard.   I want you to take at least 2 days off of work to help let your back rest and heal, but I want you to do the exercises for the next couple of weeks.

## 2021-12-09 ENCOUNTER — Other Ambulatory Visit (HOSPITAL_BASED_OUTPATIENT_CLINIC_OR_DEPARTMENT_OTHER): Payer: Self-pay | Admitting: Nurse Practitioner

## 2021-12-09 DIAGNOSIS — M545 Low back pain, unspecified: Secondary | ICD-10-CM

## 2021-12-24 DIAGNOSIS — E785 Hyperlipidemia, unspecified: Secondary | ICD-10-CM | POA: Diagnosis not present

## 2021-12-25 LAB — HEPATIC FUNCTION PANEL
ALT: 26 IU/L (ref 0–44)
AST: 26 IU/L (ref 0–40)
Albumin: 3.9 g/dL (ref 3.8–4.9)
Alkaline Phosphatase: 133 IU/L — ABNORMAL HIGH (ref 44–121)
Bilirubin Total: 0.4 mg/dL (ref 0.0–1.2)
Bilirubin, Direct: 0.15 mg/dL (ref 0.00–0.40)
Total Protein: 6.2 g/dL (ref 6.0–8.5)

## 2021-12-25 LAB — LIPID PANEL
Chol/HDL Ratio: 3.9 ratio (ref 0.0–5.0)
Cholesterol, Total: 131 mg/dL (ref 100–199)
HDL: 34 mg/dL — ABNORMAL LOW (ref >39)
LDL Chol Calc (NIH): 74 mg/dL (ref 0–99)
Triglycerides: 126 mg/dL (ref 0–149)
VLDL Cholesterol Cal: 23 mg/dL (ref 5–40)

## 2021-12-26 ENCOUNTER — Telehealth (HOSPITAL_BASED_OUTPATIENT_CLINIC_OR_DEPARTMENT_OTHER): Payer: Self-pay

## 2021-12-26 DIAGNOSIS — E785 Hyperlipidemia, unspecified: Secondary | ICD-10-CM

## 2021-12-26 NOTE — Telephone Encounter (Addendum)
Seen by patient Blake Gomez on 12/25/2021  8:44 PM, orders placed    ----- Message from Alver Sorrow, NP sent at 12/25/2021  8:13 PM EDT ----- Liver enzymes stable. Total cholesterol, triglycerides at goal. LDL (bad cholesterol) not at goal of less than 70. Recommend referral to lipid clinic for consideration of [id number].

## 2022-01-07 ENCOUNTER — Ambulatory Visit (INDEPENDENT_AMBULATORY_CARE_PROVIDER_SITE_OTHER): Payer: BC Managed Care – PPO

## 2022-01-07 DIAGNOSIS — I5042 Chronic combined systolic (congestive) and diastolic (congestive) heart failure: Secondary | ICD-10-CM

## 2022-01-07 LAB — ECHOCARDIOGRAM COMPLETE
AR max vel: 2.73 cm2
AV Area VTI: 2.83 cm2
AV Area mean vel: 2.7 cm2
AV Mean grad: 2 mmHg
AV Peak grad: 3.3 mmHg
Ao pk vel: 0.91 m/s
Area-P 1/2: 2.95 cm2
P 1/2 time: 579 msec
S' Lateral: 3.34 cm

## 2022-01-09 NOTE — Progress Notes (Signed)
Office Visit    Patient Name: Blake Gomez Date of Encounter: 01/10/2022  PCP:  Orma Render, NP   [place]  Cardiologist:  Skeet Latch, MD  Advanced Practice Provider:  No care team member to display Electrophysiologist:  None     Chief Complaint    Danford Tat is a 61 y.o. male with a hx of  combined systolic and diastolic heart failure, HTN, HLD,  CAD (stent to ostial-prox LAD in 2014), GERD, obesity presents today for follow up after echo   Past Medical History    Past Medical History:  Diagnosis Date   Acute meniscal tear of left knee    Bronchitis 05/03/2021   Bronchitis, mucopurulent recurrent ([place]) 04/2021   CAD (coronary artery disease)    stent 2014.  No MI.  Echo normal.   Chronic renal insufficiency, stage 2 (mild)    Encounter for annual physical exam 08/03/2021   GERD (gastroesophageal reflux disease)    Hay fever    Hematochezia 2017; 2019   Hematochezia/screening: 2017 Colonoscopy normal except adenomatous polyp.  2019 colonoscopy, +polyp, recall 2022.   Hemorrhoids 03/21/2016   History of adenomatous polyp of colon 2017; 2019   Recall 2022   History of hematuria    per old records   Hypercholesterolemia    Hypertension    Left knee pain 2021   MRI 10/2019-> moderate DJD medial mostly, +deg tear of medial and lateral menisci, +effusion, +popliteal cyst->recommended ortho   Obesity, Class II, BMI 35-39.9    Rash and nonspecific skin eruption 05/03/2021   Unilateral primary osteoarthritis, left knee    + deg menisc tears: Dr. Marlou Sa, steroid inj 11/2019, pt wants to avoid surgery   Past Surgical History:  Procedure Laterality Date   CARDIAC CATHETERIZATION  09/2008   95% LAD lesion-->DES   CARDIOVASCULAR STRESS TEST  2015   cardiolyte->no ischemia.  EF 60%.   COLONOSCOPY  2017/2019   Hematochezia/screening: 2017 Colonoscopy normal except adenomatous polyp.  2019 colonoscopy, +polyp, recall 2022.   CORONARY ANGIOPLASTY  WITH STENT PLACEMENT  2010   DES to LAD   KNEE ARTHROSCOPY Left 07/12/2021   Procedure: Left ARTHROSCOPY KNEE / with debridement;  Surgeon: Vanetta Mulders, MD;  Location: [facility];  Service: Orthopedics;  Laterality: Left;   RIGHT/LEFT HEART CATH AND CORONARY ANGIOGRAPHY N/A 06/12/2021   Procedure: RIGHT/LEFT HEART CATH AND CORONARY ANGIOGRAPHY;  Surgeon: Belva Crome, MD;  Location: [facility];  Service: Cardiovascular;  Laterality: N/A;    Allergies  No Known Allergies  History of Present Illness    Blake Gomez is a 61 y.o. male with a hx of  combined systolic and diastolic heart failure, HTN, HLD,  CAD (stent to ostial-prox LAD in 2014), GERD, obesity  last seen for 10/12/21  Previous stent placement at [facility] in [place], [place]. Seen as new patient 09/2019 by Dr. Bettina Gavia. He was doing well from cardiac perspective. He was recommended to continue DAPT and follow up in 1year.   Seen 05/22/21 by Christen Bame, NP for clearance for knee surgery as well as decreased pulses noted by PCP. He noted dyspnea on exertion, LE edema, PND. Metoprolol dose was reduced due to bradycardia. He was recommended for ABI which were normal. Echo showed LVEF 40-45%, gr1DD, mildly enlarged RV, mild MR/AI. Subsequent LHC to rule out ICM performed 06/12/21 showed widely patent coronary arteries, ostial-prox LAD stent patient with minimal luminal irregularities in the mid LAD. LVEF  45-50% with LVEDP 83mmHg consistent with combined CHF. He was recommended for optimization of heart failure therapy.   He was seen 07/03/21 and clearance provided for knee surgery. Farxiga added for optimization of HF therapy. Spironolactone was able to be increased to $RemoveBefo'25mg'MlTPDxlCLQf$  QD via phone after follow up labs remained stable.   At follow up 10/12/21 Lasix PRN added. Patient assistance for [place] completed. Referred to lipid clinic as LDL not at goal of <70. Follow up echo 12/2021 with LVEF improved to 50-55%.   He presents  today for follow up with his mother. Continues to stay active working in [place]. Continues low sodium diet. Taking Lasix daily with no lower extremity edema. Reports no shortness of breath nor dyspnea on exertion. Reports no chest pain, pressure, or tightness. No orthopnea, PND. Reports no palpitations.    EKGs/Labs/Other Studies Reviewed:   The following studies were reviewed today:  Echo 12/2021  1. Mid/Basal inferior and distal septal hypokinesis . Left ventricular  ejection fraction, by estimation, is 50 to 55%. The left ventricle has low  normal function. The left ventricle demonstrates regional wall motion  abnormalities (see scoring  diagram/findings for description). Left ventricular diastolic parameters  were normal. The average left ventricular global longitudinal strain is  -16.6 %. The global longitudinal strain is normal.   2. Right ventricular systolic function is normal. The right ventricular  size is normal.   3. The mitral valve is abnormal. Trivial mitral valve regurgitation. No  evidence of mitral stenosis.   4. The aortic valve is tricuspid. There is mild calcification of the  aortic valve. Aortic valve regurgitation is trivial. Aortic valve  sclerosis is present, with no evidence of aortic valve stenosis.   5. Aortic dilatation noted. There is mild dilatation of the ascending  aorta, measuring 38 mm.   6. The inferior vena cava is normal in size with greater than 50%  respiratory variability, suggesting right atrial pressure of 3 mmHg   LHC 06/12/21   CONCLUSIONS: Widely patent coronary arteries.  Right dominant anatomy. Ostial to proximal LAD stent is widely patent.  Minimal luminal irregularities are noted in the mid LAD. Low normal to mildly depressed LVEF at 45 to 50%.  LVEDP 22 mmHg.  Consistent with chronic combined systolic and diastolic heart failure.  Left ventriculography was by hand-injection. Normal pulmonary artery pressures.  Mean PA pressure 17  mmHg.  Capillary wedge pressure 7 mmHg.   RECOMMENDATIONS:   Guideline directed therapy for combined systolic and diastolic heart failure, especially considering SGLT2 therapy as an add-on to current regimen.   Coronary Findings  Diagnostic Dominance: Right Left Anterior Descending  Vessel is small.  Ost LAD to Mid LAD lesion is 15% stenosed. The lesion was previously treated .  Mid LAD lesion is 25% stenosed.    Second Diagonal Branch  2nd Diag lesion is 20% stenosed.     Right Heart  Right Heart Pressures Hemodynamic findings consistent with pulmonary hypertension. Elevated LV EDP consistent with volume overload.    Left Heart  Left Ventricle The left ventricular size is normal. There is mild left ventricular systolic dysfunction. LV end diastolic pressure is mildly elevated. The left ventricular ejection fraction is 45-50% by visual estimate. There are LV function abnormalities due to global hypokinesis.   Dominance: Right   LE ABI 06/05/21  Summary:  Right: Resting right ankle-brachial index is within normal range. No  evidence of significant right lower extremity arterial disease. The right  toe-brachial index is normal.  Left: Resting left ankle-brachial index is within normal range. No  evidence of significant left lower extremity arterial disease. The left  toe-brachial index is normal.   Echo 06/05/21   1. Left ventricular ejection fraction, by estimation, is 40 to 45%. The  left ventricle has mildly decreased function. The left ventricle  demonstrates global hypokinesis. Left ventricular diastolic parameters are  consistent with Grade I diastolic  dysfunction (impaired relaxation).   2. Right ventricular systolic function is normal. The right ventricular  size is mildly enlarged.   3. Left atrial size was mildly dilated.   4. The mitral valve is normal in structure. Mild mitral valve  regurgitation. No evidence of mitral stenosis.   5. The aortic  valve is normal in structure. Aortic valve regurgitation is  mild. No aortic stenosis is present.   6. The inferior vena cava is normal in size with greater than 50%  respiratory variability, suggesting right atrial pressure of 3 mmHg.   EKG:  No EKG today.  Recent Labs: 10/12/2021: BUN 16; Creatinine, Ser 1.07; Potassium 4.8; Sodium 140 10/31/2021: Hemoglobin 16.4; Platelets 284 12/24/2021: ALT 26  Recent Lipid Panel    Component Value Date/Time   CHOL 131 12/24/2021 0933   TRIG 126 12/24/2021 0933   HDL 34 (L) 12/24/2021 0933   CHOLHDL 3.9 12/24/2021 0933   LDLCALC 74 12/24/2021 0933    Home Medications   Current Meds  Medication Sig   AMBULATORY NON FORMULARY MEDICATION Upper arm blood pressure monitor as covered by insurance- electronic. For ICD-10  I10   aspirin 81 MG EC tablet Take 1 tablet (81 mg total) by mouth daily. Swallow whole.   atorvastatin (LIPITOR) 80 MG tablet Take once a day for cholesterol   Blood Pressure Monitoring (COMFORT TOUCH BP CUFF/LARGE) MISC 1 kit by Does not apply route daily.   clopidogrel (PLAVIX) 75 MG tablet Take one tablet a day for artery disease   cyclobenzaprine (FLEXERIL) 10 MG tablet Take 1 tablet (10 mg total) by mouth 3 (three) times daily as needed for muscle spasms.   dapagliflozin propanediol (FARXIGA) 10 MG TABS tablet Take 1 tablet (10 mg total) by mouth daily before breakfast.   ezetimibe (ZETIA) 10 MG tablet Take 1 tablet (10 mg total) by mouth daily.   Fish Oil-Krill Oil (MEGARED ADVANCED 4 IN 1 PO) Take by mouth daily.   folic acid (FOLVITE) 161 MCG tablet folic acid 096 mcg tablet  Take 1 tablet every day by oral route.   furosemide (LASIX) 20 MG tablet Take 1 tablet (20 mg total) by mouth as needed for fluid or edema.   gabapentin (NEURONTIN) 100 MG capsule Take 1-3 capsules (100-300 mg total) by mouth 3 (three) times daily as needed. For nerve pain. Use smallest dose needed to help with pain. Will cause drowsiness.    ketoconazole (NIZORAL) 2 % shampoo Apply 1 application topically 2 (two) times a week.   meloxicam (MOBIC) 15 MG tablet TAKE 1 TABLET (15 MG TOTAL) BY MOUTH DAILY. START TOMORROW.   metoprolol succinate (TOPROL-XL) 25 MG 24 hr tablet Take 0.5 tablets (12.5 mg total) by mouth daily.   mometasone (ELOCON) 0.1 % ointment Mix fingertip length with lotion and apply to arms and back of neck daily.   Multiple Vitamins-Minerals (CENTRUM SILVER PO) Centrum Silver Men 300 mcg-600 mcg-300 mcg tablet  Take 1 tablet every day by oral route.   spironolactone (ALDACTONE) 25 MG tablet Take 1 tablet (25 mg total) by mouth daily.  Current Facility-Administered Medications for the 01/10/22 encounter (Office Visit) with Loel Dubonnet, NP  Medication   dexamethasone (DECADRON) injection 10 mg     Review of Systems    All other systems reviewed and are otherwise negative except as noted above.  Physical Exam    VS:  BP 120/72   Pulse (!) 56   Ht 5' 10"  (1.778 m)   Wt 279 lb (126.6 kg)   BMI 40.03 kg/m  , BMI Body mass index is 40.03 kg/m.  Wt Readings from Last 3 Encounters:  01/10/22 279 lb (126.6 kg)  11/12/21 282 lb (127.9 kg)  10/31/21 281 lb (127.5 kg)    GEN: Well nourished, overweight, well developed, in no acute distress. HEENT: normal. Neck: Supple, no JVD, carotid bruits, or masses. Cardiac: RRR, no murmurs, rubs, or gallops. No clubbing, cyanosis, edema.  Radials/PT 2+ and equal bilaterally.  Respiratory:  Respirations regular and unlabored, clear to auscultation bilaterally. GI: Soft, nontender, nondistended. MS: No deformity or atrophy. Skin: Warm and dry, no rash. Neuro:  Strength and sensation are intact. Psych: Normal affect.  Assessment & Plan    CAD - LHC 05/2021 with patent DES to LAD. GDMT includes Aspirin, plavix, Atorvastatin, Metoprolol. Heart healthy diet and regular cardiovascular exercise encouraged.    Combined systolic and diastolic heart failure - Echo  05/2021 LVEF 40-45%. LHC 05/2021 LVEF 45-50%, patent stent with otherwise nonobstructive disease. Repeat echo 12/2021 EF 50-55%, stable wall motion abnormalities, trivial MR/AI, mild dilation ascending aorta 39m.  Lifestyle changes including low salt diet, fluid restriction <2L encouraged.  GDMT includes Metoprolol Succinate, Spironolactone, FWilder [place]    HLD, LDL goal <70 - 12/2021 total cholesterol 131, triglycerides 126, HDL 34, LDL 74, alkaline phosphatase 133, AST 26, ALT 26.  Prefers to avoid injectable therapy - will plan for lifestyle changes and to recheck lipid panel in 6 months.   HTN - BP well controlled. Encouraged home monitoring.   Obesity - Weight loss via diet and exercise encouraged. Discussed the impact being overweight would have on cardiovascular risk including heart failure and CAD.   Neuropathy - Continue to follow with PCP.   Disposition: Follow up in 6 months with TSkeet Latch MD or APP.  Signed, CLoel Dubonnet NP 01/10/2022, 8:34 AM [place]

## 2022-01-10 ENCOUNTER — Ambulatory Visit (HOSPITAL_BASED_OUTPATIENT_CLINIC_OR_DEPARTMENT_OTHER): Payer: BC Managed Care – PPO | Admitting: Family

## 2022-01-10 ENCOUNTER — Encounter (HOSPITAL_BASED_OUTPATIENT_CLINIC_OR_DEPARTMENT_OTHER): Payer: Self-pay | Admitting: Family

## 2022-01-10 VITALS — BP 120/72 | HR 56 | Ht 70.0 in | Wt 279.0 lb

## 2022-01-10 DIAGNOSIS — I25118 Atherosclerotic heart disease of native coronary artery with other forms of angina pectoris: Secondary | ICD-10-CM

## 2022-01-10 DIAGNOSIS — E785 Hyperlipidemia, unspecified: Secondary | ICD-10-CM

## 2022-01-10 DIAGNOSIS — E782 Mixed hyperlipidemia: Secondary | ICD-10-CM

## 2022-01-10 DIAGNOSIS — I10 Essential (primary) hypertension: Secondary | ICD-10-CM | POA: Diagnosis not present

## 2022-01-10 DIAGNOSIS — I5022 Chronic systolic (congestive) heart failure: Secondary | ICD-10-CM

## 2022-01-10 MED ORDER — METOPROLOL SUCCINATE ER 25 MG PO TB24: 12.5000 mg | ORAL_TABLET | Freq: Every day | ORAL | 3 refills | Status: DC

## 2022-01-10 MED ORDER — COMFORT TOUCH BP CUFF/LARGE MISC: 1.0000 | Freq: Every day | 0 refills | Status: AC

## 2022-01-10 MED ORDER — FUROSEMIDE 20 MG PO TABS: 20.0000 mg | ORAL_TABLET | ORAL | 3 refills | Status: DC | PRN

## 2022-01-10 MED ORDER — COMFORT TOUCH BP CUFF/LARGE MISC: 1.0000 | Freq: Every day | 0 refills | Status: DC

## 2022-01-10 NOTE — Patient Instructions (Addendum)
Medication Instructions:  Continue your current medications.   We have resent your blood pressure cuff prescription to [facility]. That is located at [employer]. Phone number is [phone]  We have also sent refills of your Lasix and Metoprolol   *If you need a refill on your cardiac medications before your next appointment, please call your pharmacy*   Lab Work: Your physician recommends that you return for lab work in 6 months before your office visit for fasting lipid panel, CMP  You may come to the...   [facility] (3rd floor) [address] [place], [place] [address]  Open: 8am-Noon and 1pm-4:30pm  Please ring the doorbell on the small table when you exit the elevator and the Lab Tech will come get you  [facility] at [facility] [address], [place], [place] [address] Open: 8am-1pm, then 2pm-4:30pm   [facility]- Please see attached locations sheet stapled to your lab work with address and hours.    If you have labs (blood work) drawn today and your tests are completely normal, you will receive your results only by: MyChart Message (if you have MyChart) OR A paper copy in the mail If you have any lab test that is abnormal or we need to change your treatment, we will call you to review the results.   Testing/Procedures: Your echocardiogram showed your heart muscle strength had returned to normal which is a good result!   Follow-Up: At [facility], you and your health needs are our priority.  As part of our continuing mission to provide you with exceptional heart care, we have created designated Provider Care Teams.  These Care Teams include your primary Cardiologist (physician) and Advanced Practice Providers (APPs -  Physician Assistants and Nurse Practitioners) who all work together to provide you with the care you need, when you need it.  We recommend signing up for the patient portal  called "MyChart".  Sign up information is provided on this After Visit Summary.  MyChart is used to connect with patients for Virtual Visits (Telemedicine).  Patients are able to view lab/test results, encounter notes, upcoming appointments, etc.  Non-urgent messages can be sent to your provider as well.   To learn more about what you can do with MyChart, go to [email or web address].    Your next appointment:   6 month(s)  The format for your next appointment:   In Person  Provider:   Chilton Si, MD or Gillian Shields, NP    Other Instructions  Heart Healthy Diet Recommendations: A low-salt diet is recommended. Meats should be grilled, baked, or boiled. Avoid fried foods. Focus on lean protein sources like fish or chicken with vegetables and fruits. The American Heart Association is a Chief Technology Officer!  American Heart Association Diet and Lifeystyle Recommendations   Exercise recommendations: The American Heart Association recommends 150 minutes of moderate intensity exercise weekly. Try 30 minutes of moderate intensity exercise 4-5 times per week. This could include walking, jogging, or swimming.  Important Information About Sugar

## 2022-01-11 ENCOUNTER — Other Ambulatory Visit (HOSPITAL_BASED_OUTPATIENT_CLINIC_OR_DEPARTMENT_OTHER): Payer: Self-pay | Admitting: Nurse Practitioner

## 2022-01-11 DIAGNOSIS — M545 Low back pain, unspecified: Secondary | ICD-10-CM

## 2022-01-17 ENCOUNTER — Encounter: Payer: Self-pay | Admitting: *Deleted

## 2022-02-14 ENCOUNTER — Other Ambulatory Visit (HOSPITAL_BASED_OUTPATIENT_CLINIC_OR_DEPARTMENT_OTHER): Payer: Self-pay | Admitting: Family

## 2022-02-14 NOTE — Telephone Encounter (Signed)
Rx request sent to pharmacy.  

## 2022-02-15 ENCOUNTER — Other Ambulatory Visit (HOSPITAL_BASED_OUTPATIENT_CLINIC_OR_DEPARTMENT_OTHER): Payer: Self-pay | Admitting: Nurse Practitioner

## 2022-02-15 DIAGNOSIS — M545 Low back pain, unspecified: Secondary | ICD-10-CM

## 2022-03-30 ENCOUNTER — Other Ambulatory Visit (HOSPITAL_BASED_OUTPATIENT_CLINIC_OR_DEPARTMENT_OTHER): Payer: Self-pay | Admitting: Nurse Practitioner

## 2022-03-30 DIAGNOSIS — M545 Low back pain, unspecified: Secondary | ICD-10-CM

## 2022-04-30 ENCOUNTER — Other Ambulatory Visit (HOSPITAL_BASED_OUTPATIENT_CLINIC_OR_DEPARTMENT_OTHER): Payer: Self-pay | Admitting: Nurse Practitioner

## 2022-04-30 DIAGNOSIS — M545 Low back pain, unspecified: Secondary | ICD-10-CM

## 2022-05-03 ENCOUNTER — Encounter (HOSPITAL_BASED_OUTPATIENT_CLINIC_OR_DEPARTMENT_OTHER): Payer: Self-pay | Admitting: Nurse Practitioner

## 2022-05-03 ENCOUNTER — Encounter (HOSPITAL_BASED_OUTPATIENT_CLINIC_OR_DEPARTMENT_OTHER): Payer: Self-pay

## 2022-05-03 ENCOUNTER — Ambulatory Visit (HOSPITAL_BASED_OUTPATIENT_CLINIC_OR_DEPARTMENT_OTHER): Payer: BC Managed Care – PPO | Admitting: Nurse Practitioner

## 2022-05-03 VITALS — BP 126/72 | HR 62 | Ht 70.0 in | Wt 278.0 lb

## 2022-05-03 DIAGNOSIS — E782 Mixed hyperlipidemia: Secondary | ICD-10-CM

## 2022-05-03 DIAGNOSIS — I5022 Chronic systolic (congestive) heart failure: Secondary | ICD-10-CM | POA: Diagnosis not present

## 2022-05-03 DIAGNOSIS — G6289 Other specified polyneuropathies: Secondary | ICD-10-CM | POA: Diagnosis not present

## 2022-05-03 DIAGNOSIS — N179 Acute kidney failure, unspecified: Secondary | ICD-10-CM

## 2022-05-03 DIAGNOSIS — L253 Unspecified contact dermatitis due to other chemical products: Secondary | ICD-10-CM | POA: Diagnosis not present

## 2022-05-03 DIAGNOSIS — I739 Peripheral vascular disease, unspecified: Secondary | ICD-10-CM | POA: Diagnosis not present

## 2022-05-03 DIAGNOSIS — I249 Acute ischemic heart disease, unspecified: Secondary | ICD-10-CM | POA: Diagnosis not present

## 2022-05-03 DIAGNOSIS — I10 Essential (primary) hypertension: Secondary | ICD-10-CM

## 2022-05-03 MED ORDER — METOPROLOL SUCCINATE ER 25 MG PO TB24: 12.5000 mg | ORAL_TABLET | Freq: Every day | ORAL | 3 refills | Status: DC

## 2022-05-03 MED ORDER — EZETIMIBE 10 MG PO TABS: 10.0000 mg | ORAL_TABLET | Freq: Every day | ORAL | 3 refills | Status: AC

## 2022-05-03 MED ORDER — ATORVASTATIN CALCIUM 80 MG PO TABS: ORAL_TABLET | ORAL | 3 refills | Status: DC

## 2022-05-03 MED ORDER — GABAPENTIN 100 MG PO CAPS: 100.0000 mg | ORAL_CAPSULE | Freq: Three times a day (TID) | ORAL | 11 refills | Status: DC

## 2022-05-03 MED ORDER — SPIRONOLACTONE 25 MG PO TABS: 25.0000 mg | ORAL_TABLET | Freq: Every day | ORAL | 3 refills | Status: DC

## 2022-05-03 MED ORDER — CLOPIDOGREL BISULFATE 75 MG PO TABS: ORAL_TABLET | ORAL | 3 refills | Status: DC

## 2022-05-03 MED ORDER — PREDNISONE 20 MG PO TABS: ORAL_TABLET | ORAL | 0 refills | Status: DC

## 2022-05-03 MED ORDER — NITROGLYCERIN 0.4 MG SL SUBL: 0.4000 mg | SUBLINGUAL_TABLET | SUBLINGUAL | 3 refills | Status: AC | PRN

## 2022-05-03 MED ORDER — DAPAGLIFLOZIN PROPANEDIOL 10 MG PO TABS: 10.0000 mg | ORAL_TABLET | Freq: Every day | ORAL | 3 refills | Status: AC

## 2022-05-03 MED ORDER — FUROSEMIDE 20 MG PO TABS: 20.0000 mg | ORAL_TABLET | ORAL | 3 refills | Status: AC | PRN

## 2022-05-03 NOTE — Patient Instructions (Addendum)
I have sent in a prednisone taper for you to take for the rash. I do believe you are right that this is caused by the fumes from the flux. The prednisone will help calm down and clear the allergy, but it will come back if you have exposure again. Be sure to wear long sleeves, gloves, and cover your neck with a cloth to prevent the fumes from getting on the back of your head and going down into your shirt.   I sent refills in of all of your medications so that you will have plenty available.   You can take 3 tablets of the gabapentin 3 times a day for the pain in your legs.

## 2022-05-03 NOTE — Progress Notes (Signed)
Blake Clamp, DNP, AGNP-c [facility] [address] [place], [place] [address] [phone] Office [phone] Fax  ESTABLISHED PATIENT- Chronic Health and/or Follow-Up Visit  Blood pressure 126/72, pulse 62, height 5\' 10"  (1.778 m), weight 278 lb (126.1 kg), SpO2 96 %.    Blake Gomez is a 61 y.o. year old male presenting today for evaluation and management of the following: Follow-up (Pt presents today for 6 month follow up. He is feeling good. Feet are still numb and tingling. He has a rash on bilateral hands and back of neck ? Eczema.)  Rash - only on areas that are exposed. 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   Paresthesia - he is still experiencing numbness and tingling in his feet. He does not feel this has improved at all.  He is taking all of his medications as prescribed without missed doses. He is not having shortness of breath, CP, palpitations, dizziness, HA, weakness.   All ROS negative with exception of what is listed above.   PHYSICAL EXAM Physical Exam Vitals and nursing note reviewed.  Constitutional:      Appearance: Normal appearance.  HENT:     Head: Normocephalic.  Eyes:     Extraocular Movements: Extraocular movements intact.     Pupils: Pupils are equal, round, and reactive to light.  Neck:     Vascular: No carotid bruit.  Cardiovascular:     Rate and Rhythm: Normal rate and regular rhythm.     Pulses: Normal pulses.     Heart sounds: Normal heart sounds.  Pulmonary:     Effort: Pulmonary effort is normal.     Breath sounds: Normal breath sounds.  Abdominal:     General: Bowel sounds are normal. There is no distension.     Palpations: Abdomen is soft.     Tenderness: There is no abdominal tenderness. There is no guarding.  Musculoskeletal:        General: Normal range of motion.     Cervical back: Normal range of motion.     Right lower leg:  No edema.     Left lower leg: No edema.  Lymphadenopathy:     Cervical: No cervical adenopathy.  Skin:    General: Skin is warm and dry.     Capillary Refill: Capillary refill takes less than 2 seconds.     Findings: Rash present.     Comments: Erythematous macular rash noted on the skin on all areas not covered with clothing or protective gear (hat, gloves).   Neurological:     General: No focal deficit present.     Mental Status: He is alert and oriented to person, place, and time.     Sensory: Sensory deficit present.     Comments: Sensation decrease in feet. Worse at the distal ends and on the right foot.  Psychiatric:        Mood and Affect: Mood normal.        Behavior: Behavior normal.        Thought Content: Thought content normal.        Judgment: Judgment normal.     PLAN Problem List Items Addressed This Visit     Acute renal failure (HCC)    Labs pending.       Relevant Medications   atorvastatin (LIPITOR) 80 MG tablet   clopidogrel (PLAVIX) 75 MG tablet   dapagliflozin  propanediol (FARXIGA) 10 MG TABS tablet   spironolactone (ALDACTONE) 25 MG tablet   Hypertension    Chronic. Well controlled. Labs pending. Refills sent. F/U in 6 months      Relevant Medications   atorvastatin (LIPITOR) 80 MG tablet   clopidogrel (PLAVIX) 75 MG tablet   ezetimibe (ZETIA) 10 MG tablet   dapagliflozin propanediol (FARXIGA) 10 MG TABS tablet   furosemide (LASIX) 20 MG tablet   metoprolol succinate (TOPROL-XL) 25 MG 24 hr tablet   nitroGLYCERIN (NITROSTAT) 0.4 MG SL tablet   spironolactone (ALDACTONE) 25 MG tablet   Peripheral neuropathy    Chronic. Stable, but bothersome. Increase gabapentin and monitor closely.      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 This makes sense given the areas that are affected. Will treat with topical mometasone, prednisone, and recommend that he no longer use this substance in enclosed areas and always cover his arms, face, neck, and hands when using.       Relevant Medications   predniSONE (DELTASONE) 20 MG tablet   Mixed hyperlipidemia   Relevant Medications   atorvastatin (LIPITOR) 80 MG tablet   clopidogrel (PLAVIX) 75 MG tablet   ezetimibe (ZETIA) 10 MG tablet   furosemide (LASIX) 20 MG tablet   metoprolol succinate (TOPROL-XL) 25 MG 24 hr tablet   nitroGLYCERIN (NITROSTAT) 0.4 MG SL tablet   spironolactone (ALDACTONE) 25 MG tablet   Acute coronary syndrome (HCC)   Relevant Medications   atorvastatin (LIPITOR) 80 MG tablet   clopidogrel (PLAVIX) 75 MG tablet   ezetimibe (ZETIA) 10 MG tablet   dapagliflozin propanediol (FARXIGA) 10 MG TABS tablet   furosemide (LASIX) 20 MG tablet   metoprolol succinate (TOPROL-XL) 25 MG 24 hr tablet   nitroGLYCERIN (NITROSTAT) 0.4 MG SL tablet   spironolactone (ALDACTONE) 25 MG tablet   Chronic systolic heart failure (HCC)   Relevant Medications   atorvastatin (LIPITOR) 80 MG tablet   ezetimibe (ZETIA) 10 MG tablet   furosemide (LASIX) 20 MG tablet   metoprolol succinate (TOPROL-XL) 25 MG 24 hr tablet   nitroGLYCERIN (NITROSTAT) 0.4 MG SL tablet   spironolactone (ALDACTONE) 25 MG tablet   Other Relevant Orders   CBC with Differential/Platelet (Completed)   Comprehensive metabolic panel (Completed)   Hemoglobin A1c (Completed)   Lipid panel (Completed)   TSH (Completed)   Claudication of both lower extremities (HCC)   Relevant Medications    predniSONE (DELTASONE) 20 MG tablet   gabapentin (NEURONTIN) 100 MG capsule   Other Relevant Orders   CBC with Differential/Platelet (Completed)   Comprehensive metabolic panel (Completed)   Hemoglobin A1c (Completed)   Lipid panel (Completed)   TSH (Completed)    Return in about 6 months (around 11/01/2022) for Chronic Management- will transition too [facility].   Blake Clamp, DNP, AGNP-c 05/03/2022  8:05 AM

## 2022-05-04 LAB — CBC WITH DIFFERENTIAL/PLATELET
Basophils Absolute: 0.1 10*3/uL (ref 0.0–0.2)
Basos: 1 %
EOS (ABSOLUTE): 0.3 10*3/uL (ref 0.0–0.4)
Eos: 4 %
Hematocrit: 45 % (ref 37.5–51.0)
Hemoglobin: 15.4 g/dL (ref 13.0–17.7)
Immature Grans (Abs): 0 10*3/uL (ref 0.0–0.1)
Immature Granulocytes: 0 %
Lymphocytes Absolute: 1.6 10*3/uL (ref 0.7–3.1)
Lymphs: 23 %
MCH: 32.7 pg (ref 26.6–33.0)
MCHC: 34.2 g/dL (ref 31.5–35.7)
MCV: 96 fL (ref 79–97)
Monocytes Absolute: 0.5 10*3/uL (ref 0.1–0.9)
Monocytes: 7 %
Neutrophils Absolute: 4.6 10*3/uL (ref 1.4–7.0)
Neutrophils: 65 %
Platelets: 275 10*3/uL (ref 150–450)
RBC: 4.71 x10E6/uL (ref 4.14–5.80)
RDW: 12.5 % (ref 11.6–15.4)
WBC: 7.1 10*3/uL (ref 3.4–10.8)

## 2022-05-04 LAB — COMPREHENSIVE METABOLIC PANEL
ALT: 22 IU/L (ref 0–44)
AST: 27 IU/L (ref 0–40)
Albumin/Globulin Ratio: 1.7 (ref 1.2–2.2)
Albumin: 4.3 g/dL (ref 3.8–4.9)
Alkaline Phosphatase: 129 IU/L — ABNORMAL HIGH (ref 44–121)
BUN/Creatinine Ratio: 18 (ref 9–20)
BUN: 19 mg/dL (ref 6–24)
Bilirubin Total: 0.8 mg/dL (ref 0.0–1.2)
CO2: 22 mmol/L (ref 20–29)
Calcium: 9.2 mg/dL (ref 8.7–10.2)
Chloride: 104 mmol/L (ref 96–106)
Creatinine, Ser: 1.07 mg/dL (ref 0.76–1.27)
Globulin, Total: 2.6 g/dL (ref 1.5–4.5)
Glucose: 113 mg/dL — ABNORMAL HIGH (ref 70–99)
Potassium: 4.5 mmol/L (ref 3.5–5.2)
Sodium: 141 mmol/L (ref 134–144)
Total Protein: 6.9 g/dL (ref 6.0–8.5)
eGFR: 80 mL/min/{1.73_m2} (ref >59)

## 2022-05-04 LAB — LIPID PANEL
Chol/HDL Ratio: 4.3 ratio (ref 0.0–5.0)
Cholesterol, Total: 143 mg/dL (ref 100–199)
HDL: 33 mg/dL — ABNORMAL LOW (ref >39)
LDL Chol Calc (NIH): 90 mg/dL (ref 0–99)
Triglycerides: 105 mg/dL (ref 0–149)
VLDL Cholesterol Cal: 20 mg/dL (ref 5–40)

## 2022-05-04 LAB — TSH: TSH: 1.28 u[IU]/mL (ref 0.450–4.500)

## 2022-05-04 LAB — T4, FREE: Free T4: 1.13 ng/dL (ref 0.82–1.77)

## 2022-05-04 LAB — B12 AND FOLATE PANEL
Folate: 18.5 ng/mL (ref >3.0)
Vitamin B-12: 316 pg/mL (ref 232–1245)

## 2022-05-04 LAB — HEMOGLOBIN A1C
Est. average glucose Bld gHb Est-mCnc: 114 mg/dL
Hgb A1c MFr Bld: 5.6 % (ref 4.8–5.6)

## 2022-05-23 ENCOUNTER — Encounter (HOSPITAL_BASED_OUTPATIENT_CLINIC_OR_DEPARTMENT_OTHER): Payer: Self-pay

## 2022-05-28 ENCOUNTER — Other Ambulatory Visit (HOSPITAL_BASED_OUTPATIENT_CLINIC_OR_DEPARTMENT_OTHER): Payer: Self-pay | Admitting: Nurse Practitioner

## 2022-05-28 DIAGNOSIS — I10 Essential (primary) hypertension: Secondary | ICD-10-CM

## 2022-05-28 DIAGNOSIS — I249 Acute ischemic heart disease, unspecified: Secondary | ICD-10-CM

## 2022-05-28 DIAGNOSIS — N179 Acute kidney failure, unspecified: Secondary | ICD-10-CM

## 2022-06-02 ENCOUNTER — Other Ambulatory Visit (HOSPITAL_BASED_OUTPATIENT_CLINIC_OR_DEPARTMENT_OTHER): Payer: Self-pay | Admitting: Nurse Practitioner

## 2022-06-02 DIAGNOSIS — M545 Low back pain, unspecified: Secondary | ICD-10-CM

## 2022-06-20 DIAGNOSIS — L253 Unspecified contact dermatitis due to other chemical products: Secondary | ICD-10-CM | POA: Insufficient documentation

## 2022-06-20 NOTE — Assessment & Plan Note (Signed)
Chronic. Well controlled. Labs pending. Refills sent. F/U in 6 months

## 2022-06-20 NOTE — Assessment & Plan Note (Signed)
Chronic. Stable, but bothersome. Increase gabapentin and monitor closely.

## 2022-06-20 NOTE — Assessment & Plan Note (Signed)
Symptoms consistent with contact to substance coming in contact with uncovered skin. 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 This makes sense given the areas that are affected. Will treat with topical mometasone, prednisone, and recommend that he no longer use this substance in enclosed areas and always cover his arms, face, neck, and hands when using.

## 2022-06-20 NOTE — Assessment & Plan Note (Signed)
Labs pending.  

## 2022-06-26 ENCOUNTER — Other Ambulatory Visit (HOSPITAL_BASED_OUTPATIENT_CLINIC_OR_DEPARTMENT_OTHER): Payer: Self-pay | Admitting: Nurse Practitioner

## 2022-06-26 DIAGNOSIS — I10 Essential (primary) hypertension: Secondary | ICD-10-CM

## 2022-06-26 DIAGNOSIS — I249 Acute ischemic heart disease, unspecified: Secondary | ICD-10-CM

## 2022-06-26 DIAGNOSIS — E782 Mixed hyperlipidemia: Secondary | ICD-10-CM

## 2022-06-26 NOTE — Telephone Encounter (Signed)
Patient is scheduled with Dr. Burnard Bunting in May, thanks.

## 2022-07-01 ENCOUNTER — Other Ambulatory Visit (HOSPITAL_BASED_OUTPATIENT_CLINIC_OR_DEPARTMENT_OTHER): Payer: Self-pay | Admitting: Family Medicine

## 2022-07-01 DIAGNOSIS — M545 Low back pain, unspecified: Secondary | ICD-10-CM

## 2022-07-15 ENCOUNTER — Telehealth (HOSPITAL_BASED_OUTPATIENT_CLINIC_OR_DEPARTMENT_OTHER): Payer: Self-pay

## 2022-07-15 ENCOUNTER — Encounter (HOSPITAL_BASED_OUTPATIENT_CLINIC_OR_DEPARTMENT_OTHER): Payer: Self-pay | Admitting: Family

## 2022-07-15 ENCOUNTER — Other Ambulatory Visit (HOSPITAL_BASED_OUTPATIENT_CLINIC_OR_DEPARTMENT_OTHER): Payer: Self-pay | Admitting: Family

## 2022-07-15 ENCOUNTER — Ambulatory Visit (HOSPITAL_BASED_OUTPATIENT_CLINIC_OR_DEPARTMENT_OTHER): Payer: BC Managed Care – PPO | Admitting: Family

## 2022-07-15 VITALS — BP 128/82 | HR 83 | Ht 70.0 in | Wt 275.6 lb

## 2022-07-15 DIAGNOSIS — I25118 Atherosclerotic heart disease of native coronary artery with other forms of angina pectoris: Secondary | ICD-10-CM

## 2022-07-15 DIAGNOSIS — I10 Essential (primary) hypertension: Secondary | ICD-10-CM

## 2022-07-15 DIAGNOSIS — Z6839 Body mass index (BMI) 39.0-39.9, adult: Secondary | ICD-10-CM

## 2022-07-15 DIAGNOSIS — E785 Hyperlipidemia, unspecified: Secondary | ICD-10-CM | POA: Diagnosis not present

## 2022-07-15 DIAGNOSIS — I5022 Chronic systolic (congestive) heart failure: Secondary | ICD-10-CM

## 2022-07-15 MED ORDER — SEMAGLUTIDE-WEIGHT MANAGEMENT 0.5 MG/0.5ML ~~LOC~~ SOAJ: 0.5000 mg | SUBCUTANEOUS | 0 refills | Status: DC

## 2022-07-15 MED ORDER — SEMAGLUTIDE-WEIGHT MANAGEMENT 0.25 MG/0.5ML ~~LOC~~ SOAJ: 0.2500 mg | SUBCUTANEOUS | 0 refills | Status: DC

## 2022-07-15 MED ORDER — SEMAGLUTIDE-WEIGHT MANAGEMENT 1.7 MG/0.75ML ~~LOC~~ SOAJ: 1.7000 mg | SUBCUTANEOUS | 0 refills | Status: DC

## 2022-07-15 MED ORDER — SEMAGLUTIDE-WEIGHT MANAGEMENT 2.4 MG/0.75ML ~~LOC~~ SOAJ: 2.4000 mg | SUBCUTANEOUS | 0 refills | Status: DC

## 2022-07-15 MED ORDER — SEMAGLUTIDE-WEIGHT MANAGEMENT 1 MG/0.5ML ~~LOC~~ SOAJ: 1.0000 mg | SUBCUTANEOUS | 0 refills | Status: DC

## 2022-07-15 MED ORDER — REPATHA SURECLICK 140 MG/ML ~~LOC~~ SOAJ: 140.0000 mg | SUBCUTANEOUS | 6 refills | Status: AC

## 2022-07-15 NOTE — Telephone Encounter (Signed)
Received message from Cover my meds, weight loss not covered under patient's insurance. Patient notified.

## 2022-07-15 NOTE — Progress Notes (Signed)
Office Visit    Patient Name: Blake Gomez Date of Encounter: 07/15/2022  PCP:  [place], Buren Kos, MD   [facility]  Cardiologist:  Chilton Si, MD  Advanced Practice Provider:  No care team member to display Electrophysiologist:  None     Chief Complaint    Blake Gomez is a 62 y.o. male  presents today for CAD follow up  Past Medical History    Past Medical History:  Diagnosis Date   Acute meniscal tear of left knee    Bronchitis 05/03/2021   Bronchitis, mucopurulent recurrent (HCC) 04/2021   CAD (coronary artery disease)    stent 2014.  No MI.  Echo normal.   Chronic renal insufficiency, stage 2 (mild)    Encounter for annual physical exam 08/03/2021   GERD (gastroesophageal reflux disease)    Hay fever    Hematochezia 2017; 2019   Hematochezia/screening: 2017 Colonoscopy normal except adenomatous polyp.  2019 colonoscopy, +polyp, recall 2022.   Hemorrhoids 03/21/2016   History of adenomatous polyp of colon 2017; 2019   Recall 2022   History of hematuria    per old records   Hypercholesterolemia    Hypertension    Left knee pain 2021   MRI 10/2019-> moderate DJD medial mostly, +deg tear of medial and lateral menisci, +effusion, +popliteal cyst->recommended ortho   Obesity, Class II, BMI 35-39.9    Rash and nonspecific skin eruption 05/03/2021   Unilateral primary osteoarthritis, left knee    + deg menisc tears: Dr. August Saucer, steroid inj 11/2019, pt wants to avoid surgery   Past Surgical History:  Procedure Laterality Date   CARDIAC CATHETERIZATION  09/2008   95% LAD lesion-->DES   CARDIOVASCULAR STRESS TEST  2015   cardiolyte->no ischemia.  EF 60%.   COLONOSCOPY  2017/2019   Hematochezia/screening: 2017 Colonoscopy normal except adenomatous polyp.  2019 colonoscopy, +polyp, recall 2022.   CORONARY ANGIOPLASTY WITH STENT PLACEMENT  2010   DES to LAD   KNEE ARTHROSCOPY Left 07/12/2021   Procedure: Left ARTHROSCOPY KNEE / with  debridement;  Surgeon: Huel Cote, MD;  Location: [facility] OR;  Service: Orthopedics;  Laterality: Left;   RIGHT/LEFT HEART CATH AND CORONARY ANGIOGRAPHY N/A 06/12/2021   Procedure: RIGHT/LEFT HEART CATH AND CORONARY ANGIOGRAPHY;  Surgeon: Lyn Records, MD;  Location: [facility] INVASIVE CV LAB;  Service: Cardiovascular;  Laterality: N/A;    Allergies  No Known Allergies  History of Present Illness    Blake Gomez is a 62 y.o. male with a hx of  combined systolic and diastolic heart failure, HTN, HLD,  CAD (stent to ostial-prox LAD in 2014), GERD, obesity  last seen 01/10/22.  Previous stent placement at [facility] in [place], [place]. Seen as new patient 09/2019 by Dr. Dulce Sellar. He was doing well from cardiac perspective. He was recommended to continue DAPT and follow up in 1year.   Seen 05/22/21 by Eligha Bridegroom, NP for clearance for knee surgery as well as decreased pulses noted by PCP. He noted dyspnea on exertion, LE edema, PND. Metoprolol dose was reduced due to bradycardia. He was recommended for ABI which were normal. Echo showed LVEF 40-45%, gr1DD, mildly enlarged RV, mild MR/AI. Subsequent LHC to rule out ICM performed 06/12/21 showed widely patent coronary arteries, ostial-prox LAD stent patient with minimal luminal irregularities in the mid LAD. LVEF 45-50% with LVEDP consistent with combined CHF. He was recommended for optimization of heart failure therapy.   He was seen 07/03/21 and  clearance provided for knee surgery. Farxiga added for optimization of HF therapy. Spironolactone was able to be increased to 25mg  QD via phone after follow up labs remained stable.   At follow up 10/12/21 Lasix PRN added. Patient assistance for [place] completed. Follow up echo 12/2021 with LVEF improved to 50-55%. Last seen 12/2021 and wished to stay on zetia, atorvastatin alone and work on lifestyle changes for cholestero.  He presents today for follow up with his mother. 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 He is understandably frustrated as active at work and working on diet (focusing on grilled foods, reducing carbs) but not much weight loss. Continues low sodium diet. Taking Lasix daily with no lower extremity edema. Reports no shortness of breath nor dyspnea on exertion. Reports no chest pain, pressure, or tightness. No orthopnea, PND. Reports no palpitations.    EKGs/Labs/Other Studies Reviewed:   The following studies were reviewed today:  Echo 12/2021  1. Mid/Basal inferior and distal septal hypokinesis . Left ventricular  ejection fraction, by estimation, is 50 to 55%. The left ventricle has low  normal function. The left ventricle demonstrates regional wall motion  abnormalities (see scoring  diagram/findings for description). Left ventricular diastolic parameters  were normal. The average left ventricular global longitudinal strain is  -16.6 %. The global longitudinal strain is normal.   2. Right ventricular systolic function is normal. The right ventricular  size is normal.   3. The mitral valve is abnormal. Trivial mitral valve regurgitation. No  evidence of mitral stenosis.   4. The aortic valve is tricuspid. There is mild calcification of the  aortic valve. Aortic valve regurgitation is trivial. Aortic valve  sclerosis is present, with no evidence of aortic valve stenosis.   5. Aortic dilatation noted. There is mild dilatation of the ascending  aorta, measuring 38 mm.   6. The inferior vena cava is normal in size with greater than 50%  respiratory variability, suggesting right atrial pressure of 3 mmHg   LHC 06/12/21   CONCLUSIONS: Widely patent coronary arteries.  Right dominant anatomy. Ostial to proximal LAD stent is widely patent.  Minimal luminal irregularities are noted in the mid LAD. Low normal to mildly depressed LVEF at 45 to 50%.  LVEDP 22 mmHg.  Consistent with chronic combined systolic and diastolic heart failure.  Left ventriculography was  by hand-injection. Normal pulmonary artery pressures.  Mean PA pressure 17 mmHg.  Capillary wedge pressure 7 mmHg.   RECOMMENDATIONS:   Guideline directed therapy for combined systolic and diastolic heart failure, especially considering SGLT2 therapy as an add-on to current regimen.   Coronary Findings  Diagnostic Dominance: Right Left Anterior Descending  Vessel is small.  Ost LAD to Mid LAD lesion is 15% stenosed. The lesion was previously treated .  Mid LAD lesion is 25% stenosed.    Second Diagonal Branch  2nd Diag lesion is 20% stenosed.     Right Heart  Right Heart Pressures Hemodynamic findings consistent with pulmonary hypertension. Elevated LV EDP consistent with volume overload.    Left Heart  Left Ventricle The left ventricular size is normal. There is mild left ventricular systolic dysfunction. LV end diastolic pressure is mildly elevated. The left ventricular ejection fraction is 45-50% by visual estimate. There are LV function abnormalities due to global hypokinesis.   Dominance: Right   LE ABI 06/05/21  Summary:  Right: Resting right ankle-brachial index is within normal range. No  evidence of significant right lower extremity arterial disease. The right  toe-brachial index is normal.   Left: Resting left ankle-brachial index is within normal range. No  evidence of significant left lower extremity arterial disease. The left  toe-brachial index is normal.   Echo 06/05/21   1. Left ventricular ejection fraction, by estimation, is 40 to 45%. The  left ventricle has mildly decreased function. The left ventricle  demonstrates global hypokinesis. Left ventricular diastolic parameters are  consistent with Grade I diastolic  dysfunction (impaired relaxation).   2. Right ventricular systolic function is normal. The right ventricular  size is mildly enlarged.   3. Left atrial size was mildly dilated.   4. The mitral valve is normal in structure. Mild mitral  valve  regurgitation. No evidence of mitral stenosis.   5. The aortic valve is normal in structure. Aortic valve regurgitation is  mild. No aortic stenosis is present.   6. The inferior vena cava is normal in size with greater than 50%  respiratory variability, suggesting right atrial pressure of 3 mmHg.   EKG:  No EKG today.  Recent Labs: 05/03/2022: ALT 22; BUN 19; Creatinine, Ser 1.07; Hemoglobin 15.4; Platelets 275; Potassium 4.5; Sodium 141; TSH 1.280  Recent Lipid Panel    Component Value Date/Time   CHOL 143 05/03/2022 0905   TRIG 105 05/03/2022 0905   HDL 33 (L) 05/03/2022 0905   CHOLHDL 4.3 05/03/2022 0905   LDLCALC 90 05/03/2022 0905    Home Medications   Current Meds  Medication Sig   AMBULATORY NON FORMULARY MEDICATION Upper arm blood pressure monitor as covered by insurance- electronic. For ICD-10  I10   aspirin 81 MG EC tablet Take 1 tablet (81 mg total) by mouth daily. Swallow whole.   atorvastatin (LIPITOR) 80 MG tablet Take once a day for cholesterol   Blood Pressure Monitoring (COMFORT TOUCH BP CUFF/LARGE) MISC 1 kit by Does not apply route daily.   clopidogrel (PLAVIX) 75 MG tablet Take one tablet a day for artery disease   cyclobenzaprine (FLEXERIL) 10 MG tablet Take 1 tablet (10 mg total) by mouth 3 (three) times daily as needed for muscle spasms.   dapagliflozin propanediol (FARXIGA) 10 MG TABS tablet Take 1 tablet (10 mg total) by mouth daily before breakfast.   Evolocumab (REPATHA SURECLICK) 194 MG/ML SOAJ Inject 140 mg into the skin every 14 (fourteen) days.   ezetimibe (ZETIA) 10 MG tablet Take 1 tablet (10 mg total) by mouth daily.   Fish Oil-Krill Oil (MEGARED ADVANCED 4 IN 1 PO) Take by mouth daily.   folic acid (FOLVITE) 174 MCG tablet folic acid 081 mcg tablet  Take 1 tablet every day by oral route.   furosemide (LASIX) 20 MG tablet Take 1 tablet (20 mg total) by mouth as needed for fluid or edema.   gabapentin (NEURONTIN) 100 MG capsule Take 1-3  capsules (100-300 mg total) by mouth 3 (three) times daily. For nerve pain. Use smallest dose needed to help with pain. Will cause drowsiness.   ketoconazole (NIZORAL) 2 % shampoo Apply 1 application topically 2 (two) times a week.   meloxicam (MOBIC) 15 MG tablet TAKE 1 TABLET (15 MG TOTAL) BY MOUTH DAILY. START TOMORROW.   metoprolol succinate (TOPROL-XL) 25 MG 24 hr tablet TAKE ONE TABLET ONCE A DAY FOR BLOOD PRESSURE AND HEART RATE   mometasone (ELOCON) 0.1 % ointment Mix fingertip length with lotion and apply to arms and back of neck daily.   Multiple Vitamins-Minerals (CENTRUM SILVER PO) Centrum Silver Men 300 mcg-600 mcg-300 mcg tablet  Take 1 tablet every day by oral route.   nitroGLYCERIN (NITROSTAT) 0.4 MG SL tablet Place 1 tablet (0.4 mg total) under the tongue every 5 (five) minutes as needed for chest pain.   predniSONE (DELTASONE) 20 MG tablet Take 60mg  PO daily x 3 days, then 40mg  PO daily x 3 days, then 20mg  PO daily x 3 days, then 10mg  PO daily x 4 days.   Semaglutide-Weight Management 0.25 MG/0.5ML SOAJ Inject 0.25 mg into the skin once a week for 28 days.   [START ON 08/13/2022] Semaglutide-Weight Management 0.5 MG/0.5ML SOAJ Inject 0.5 mg into the skin once a week for 28 days.   [START ON 09/11/2022] Semaglutide-Weight Management 1 MG/0.5ML SOAJ Inject 1 mg into the skin once a week for 28 days.   [START ON 10/10/2022] Semaglutide-Weight Management 1.7 MG/0.75ML SOAJ Inject 1.7 mg into the skin once a week for 28 days.   [START ON 11/08/2022] Semaglutide-Weight Management 2.4 MG/0.75ML SOAJ Inject 2.4 mg into the skin once a week for 28 days.   spironolactone (ALDACTONE) 25 MG tablet Take 1 tablet (25 mg total) by mouth daily.     Review of Systems    All other systems reviewed and are otherwise negative except as noted above.  Physical Exam    VS:  BP 128/82 (BP Location: Right Arm, Patient Position: Sitting, Cuff Size: Large)   Pulse 83   Ht 5\' 10"  (1.778 m)   Wt 275 lb  9.6 oz (125 kg)   SpO2 98%   BMI 39.54 kg/m  , BMI Body mass index is 39.54 kg/m.  Wt Readings from Last 3 Encounters:  07/15/22 275 lb 9.6 oz (125 kg)  05/03/22 278 lb (126.1 kg)  01/10/22 279 lb (126.6 kg)    GEN: Well nourished, overweight, well developed, in no acute distress. HEENT: normal. Neck: Supple, no JVD, carotid bruits, or masses. Cardiac: RRR, no murmurs, rubs, or gallops. No clubbing, cyanosis, edema.  Radials/PT 2+ and equal bilaterally.  Respiratory:  Respirations regular and unlabored, clear to auscultation bilaterally. GI: Soft, nontender, nondistended. MS: No deformity or atrophy. Skin: Warm and dry, no rash. Neuro:  Strength and sensation are intact. Psych: Normal affect.  Assessment & Plan    CAD - LHC 05/2021 with patent DES to LAD. Stable with no anginal symptoms. No indication for ischemic evaluation.  GDMT includes Aspirin, plavix, Atorvastatin, Metoprolol. Heart healthy diet and regular cardiovascular exercise encouraged.    Combined systolic and diastolic heart failure - Echo 05/2021 LVEF 40-45%. LHC 05/2021 LVEF 45-50%, patent stent with otherwise nonobstructive disease. Repeat echo 12/2021 EF 50-55%, stable wall motion abnormalities, trivial MR/AI, mild dilation ascending aorta 35mm.  Lifestyle changes including low salt diet, fluid restriction <2L encouraged.  GDMT includes Metoprolol Succinate, Spironolactone, Farxiga, Furosemide.   HLD, LDL goal <70 - 05/03/22 LDL 90. Continue Atorvastatin 80mg  QD, Zetia 10mg  QD. Start Repatha q14 days. Repeat FLP/LFT in 3 months. If LDL at goal at that time plan to discontinue Zetia.  HTN - BP well controlled. Continue current antihypertensive regimen.   Encouraged home monitoring.   Obesity - Weight loss via diet and exercise encouraged. Discussed the impact being overweight would have on cardiovascular risk including heart failure and CAD. Rx Wegovy 0.25mg  weekly x 4 weeks ? 0.5mg  weekly x 4 weeks ? 1mg  weekly x 4  weeks  Neuropathy - Continue to follow with PCP.   Disposition: Follow up in 6 months with Skeet Latch, MD or APP.  Signed, Martie Lee  Dan Humphreys, NP 07/15/2022, 8:23 AM [facility]

## 2022-07-15 NOTE — Patient Instructions (Signed)
Medication Instructions:  Your physician has recommended you make the following change in your medication:  Start: Repatha 140mg  injection once every 14 days   Start: [facility] 0.25mg  weekly x4 weeks, 0.5mg  weekly x4 weeks, 1mg  weekly x4 weeks, 1.7mg  weekly x4 weeks, and then 2.4mg  weekly   Blake Gomez will send prior authorizations for both of these medications. Please do not attempt to pick them up from the pharmacy until you hear from [place]!  *If you need a refill on your cardiac medications before your next appointment, please call your pharmacy*   Lab Work: Please return for Lab work in 3 months for fasting Lipid Panel and Liver Function Tests. You may come to the...   [facility] (3rd floor) [address] [place], [place] [address]  Open: 8am-Noon and 1pm-4:30pm  Please ring the doorbell on the small table when you exit the elevator and the Lab Tech will come get you  [place] at [facility] [address] [place], [place], [place] [address] Open: 8am-1pm, then 2pm-4:30pm   [place]- Please see attached locations sheet stapled to your lab work with address and hours.   If you have labs (blood work) drawn today and your tests are completely normal, you will receive your results only by: [place] (if you have MyChart) OR A paper copy in the mail If you have any lab test that is abnormal or we need to change your treatment, we will call you to review the results.  Follow-Up: At [facility], you and your health needs are our priority.  As part of our continuing mission to provide you with exceptional heart care, we have created designated Provider Care Teams.  These Care Teams include your primary Cardiologist (physician) and Advanced Practice Providers (APPs -  Physician Assistants and Nurse Practitioners) who all work together to provide you with the care you need, when you need it.  We recommend signing up for the patient portal  called "MyChart".  Sign up information is provided on this After Visit Summary.  MyChart is used to connect with patients for Virtual Visits (Telemedicine).  Patients are able to view lab/test results, encounter notes, upcoming appointments, etc.  Non-urgent messages can be sent to your provider as well.   To learn more about what you can do with MyChart, go to [email or web address].    Your next appointment:   6 month(s)  Provider:   Skeet Latch, MD or Laurann Montana, NP    Other Instructions Heart Healthy Diet Recommendations: A low-salt diet is recommended. Meats should be grilled, baked, or boiled. Avoid fried foods. Focus on lean protein sources like fish or chicken with vegetables and fruits. The American Heart Association is a Microbiologist!  American Heart Association Diet and Lifeystyle Recommendations   Exercise recommendations: The American Heart Association recommends 150 minutes of moderate intensity exercise weekly. Try 30 minutes of moderate intensity exercise 4-5 times per week. This could include walking, jogging, or swimming.

## 2022-07-15 NOTE — Telephone Encounter (Signed)
Update received from cover my meds, see below. Attempted to call patient, no answer, VM left with update verbal permission from patient.    "This request has received a Favorable outcome from [place].  Please keep in mind this is not a guarantee of payment. Eligibility and Benefit determinations will be made at the time of service.  Please note any additional information provided by [facility] at the bottom of the screen.  Approved from 07/15/2022-07/14/2023"

## 2022-07-15 NOTE — Addendum Note (Signed)
Addended by: Gerald Stabs on: 07/15/2022 10:34 AM   Modules accepted: Orders

## 2022-07-15 NOTE — Telephone Encounter (Signed)
Medication discontinued, prior auth not approved by insurance

## 2022-07-15 NOTE — Telephone Encounter (Signed)
Repatha prior auth submitted via cover my meds, clinical questions answered, OV, med list and insurance card submitted with prior auth.    Awaiting Determination

## 2022-07-15 NOTE — Telephone Encounter (Signed)
[place] Prior auth started via cover my meds, clinical questions answered, office note, med list and insurance card submitted   Awaiting Determination

## 2022-08-01 ENCOUNTER — Other Ambulatory Visit (HOSPITAL_BASED_OUTPATIENT_CLINIC_OR_DEPARTMENT_OTHER): Payer: Self-pay | Admitting: Family Medicine

## 2022-08-01 DIAGNOSIS — M545 Low back pain, unspecified: Secondary | ICD-10-CM

## 2022-09-07 ENCOUNTER — Other Ambulatory Visit (HOSPITAL_BASED_OUTPATIENT_CLINIC_OR_DEPARTMENT_OTHER): Payer: Self-pay | Admitting: Family Medicine

## 2022-09-07 DIAGNOSIS — M545 Low back pain, unspecified: Secondary | ICD-10-CM

## 2022-11-01 DIAGNOSIS — I10 Essential (primary) hypertension: Secondary | ICD-10-CM | POA: Diagnosis not present

## 2022-11-01 DIAGNOSIS — I5022 Chronic systolic (congestive) heart failure: Secondary | ICD-10-CM | POA: Diagnosis not present

## 2022-11-01 DIAGNOSIS — I25118 Atherosclerotic heart disease of native coronary artery with other forms of angina pectoris: Secondary | ICD-10-CM | POA: Diagnosis not present

## 2022-11-01 DIAGNOSIS — E785 Hyperlipidemia, unspecified: Secondary | ICD-10-CM | POA: Diagnosis not present

## 2022-11-02 LAB — HEPATIC FUNCTION PANEL
ALT: 20 IU/L (ref 0–44)
AST: 24 IU/L (ref 0–40)
Albumin: 4.4 g/dL (ref 3.8–4.9)
Alkaline Phosphatase: 128 IU/L — ABNORMAL HIGH (ref 44–121)
Bilirubin Total: 0.8 mg/dL (ref 0.0–1.2)
Bilirubin, Direct: 0.21 mg/dL (ref 0.00–0.40)
Total Protein: 6.8 g/dL (ref 6.0–8.5)

## 2022-11-02 LAB — LIPID PANEL
Chol/HDL Ratio: 2.5 ratio (ref 0.0–5.0)
Cholesterol, Total: 94 mg/dL — ABNORMAL LOW (ref 100–199)
HDL: 37 mg/dL — ABNORMAL LOW (ref >39)
LDL Chol Calc (NIH): 40 mg/dL (ref 0–99)
Triglycerides: 85 mg/dL (ref 0–149)
VLDL Cholesterol Cal: 17 mg/dL (ref 5–40)

## 2022-11-05 ENCOUNTER — Ambulatory Visit (HOSPITAL_BASED_OUTPATIENT_CLINIC_OR_DEPARTMENT_OTHER): Payer: BC Managed Care – PPO | Admitting: Family Medicine

## 2022-11-05 ENCOUNTER — Encounter (HOSPITAL_BASED_OUTPATIENT_CLINIC_OR_DEPARTMENT_OTHER): Payer: Self-pay | Admitting: Family Medicine

## 2022-11-05 VITALS — BP 119/52 | HR 73 | Ht 70.0 in | Wt 270.3 lb

## 2022-11-05 DIAGNOSIS — R21 Rash and other nonspecific skin eruption: Secondary | ICD-10-CM | POA: Diagnosis not present

## 2022-11-05 DIAGNOSIS — I10 Essential (primary) hypertension: Secondary | ICD-10-CM | POA: Diagnosis not present

## 2022-11-05 NOTE — Assessment & Plan Note (Signed)
Ongoing for about 8 months.  With prior PCP, was felt to be related to irritant exposure at work.  Unfortunately, he does use a daily and thus has not been able to limit exposure very well.  Does have some associated itchiness, no pain, discharge, bruising.  Given duration of symptoms, he would like to have further evaluation with dermatologist. Rashes present over bilateral upper extremities, generally beginning at the end of sleep or should extending distally.  He also has rash present around his neck.  We again discussed potential for this to be related to work exposure and trying to limit his exposure either with decreased use of skin irritant or wearing protective clothing to limit heat exposure. We can proceed with referral to dermatology, placed today.

## 2022-11-05 NOTE — Progress Notes (Signed)
    Procedures performed today:    None.  Independent interpretation of notes and tests performed by another provider:   None.  Brief History, Exam, Impression, and Recommendations:    BP (!) 119/52 (BP Location: Left Arm, Patient Position: Sitting, Cuff Size: Large)   Pulse 73   Ht 5\' 10"  (1.778 m)   Wt 270 lb 4.8 oz (122.6 kg)   SpO2 99%   BMI 38.78 kg/m   Rash Ongoing for about 8 months.  With prior PCP, was felt to be related to irritant exposure at work.  Unfortunately, he does use a daily and thus has not been able to limit exposure very well.  Does have some associated itchiness, no pain, discharge, bruising.  Given duration of symptoms, he would like to have further evaluation with dermatologist. Rashes present over bilateral upper extremities, generally beginning at the end of sleep or should extending distally.  He also has rash present around his neck.  We again discussed potential for this to be related to work exposure and trying to limit his exposure either with decreased use of skin irritant or wearing protective clothing to limit heat exposure. We can proceed with referral to dermatology, placed today.  Hypertension Blood pressure at goal in office.  No changes to medications today.  Recommend intermittent monitoring of blood pressure at home, DASH diet.  He does have upcoming appointment with cardiology  Return in about 4 months (around 03/08/2023) for HTN.   ___________________________________________ Hazel Wrinkle de [place], MD, ABFM, CAQSM Primary Care and Sports Medicine [facility]

## 2022-11-05 NOTE — Assessment & Plan Note (Signed)
Blood pressure at goal in office.  No changes to medications today.  Recommend intermittent monitoring of blood pressure at home, DASH diet.  He does have upcoming appointment with cardiology

## 2023-02-04 NOTE — Progress Notes (Unsigned)
Subjective:   Blake Gomez 06-29-1960 02/05/2023  No chief complaint on file.   HPI: Blake Gomez presents today for re-assessment and management of chronic medical conditions.   The following portions of the patient's history were reviewed and updated as appropriate: past medical history, past surgical history, family history, social history, allergies, medications, and problem list.   Patient Active Problem List   Diagnosis Date Noted   Contact dermatitis due to chemicals 06/20/2022   Acute left-sided low back pain without sciatica 11/12/2021   Claudication of both lower extremities (HCC) 08/07/2021   Peripheral neuropathy 07/20/2021   Chronic systolic heart failure (HCC)    Hypertension 05/03/2021   Rash 05/03/2021   History of colon polyps 05/03/2021   Chronic pain of left knee 05/03/2021   Mixed hyperlipidemia 10/15/2008   Acute renal failure (HCC) 10/13/2008   Acute coronary syndrome (HCC) 10/13/2008   Precordial pain 10/13/2008   Past Medical History:  Diagnosis Date   Acute meniscal tear of left knee    Bronchitis 05/03/2021   Bronchitis, mucopurulent recurrent (HCC) 04/2021   CAD (coronary artery disease)    stent 2014.  No MI.  Echo normal.   Chronic renal insufficiency, stage 2 (mild)    Encounter for annual physical exam 08/03/2021   GERD (gastroesophageal reflux disease)    Hay fever    Hematochezia 2017; 2019   Hematochezia/screening: 2017 Colonoscopy normal except adenomatous polyp.  2019 colonoscopy, +polyp, recall 2022.   Hemorrhoids 03/21/2016   History of adenomatous polyp of colon 2017; 2019   Recall 2022   History of hematuria    per old records   Hypercholesterolemia    Hypertension    Left knee pain 2021   MRI 10/2019-> moderate DJD medial mostly, +deg tear of medial and lateral menisci, +effusion, +popliteal cyst->recommended ortho   Obesity, Class II, BMI 35-39.9    Rash and nonspecific skin eruption 05/03/2021   Unilateral primary  osteoarthritis, left knee    + deg menisc tears: Dr. August Saucer, steroid inj 11/2019, pt wants to avoid surgery   Past Surgical History:  Procedure Laterality Date   CARDIAC CATHETERIZATION  09/2008   95% LAD lesion-->DES   CARDIOVASCULAR STRESS TEST  2015   cardiolyte->no ischemia.  EF 60%.   COLONOSCOPY  2017/2019   Hematochezia/screening: 2017 Colonoscopy normal except adenomatous polyp.  2019 colonoscopy, +polyp, recall 2022.   CORONARY ANGIOPLASTY WITH STENT PLACEMENT  2010   DES to LAD   KNEE ARTHROSCOPY Left 07/12/2021   Procedure: Left ARTHROSCOPY KNEE / with debridement;  Surgeon: Huel Cote, MD;  Location: [facility] OR;  Service: Orthopedics;  Laterality: Left;   RIGHT/LEFT HEART CATH AND CORONARY ANGIOGRAPHY N/A 06/12/2021   Procedure: RIGHT/LEFT HEART CATH AND CORONARY ANGIOGRAPHY;  Surgeon: Lyn Records, MD;  Location: [facility] IN[facility];  Service: Cardiovascular;  Laterality: N/A;   Family History  Problem Relation Age of Onset   Heart disease Mother    Diverticulitis Mother    Cancer Father    Early death Brother        car accident   Alzheimer's disease Maternal Grandmother    Outpatient Medications Prior to Visit  Medication Sig Dispense Refill   AMBULATORY NON FORMULARY MEDICATION Upper arm blood pressure monitor as covered by insurance- electronic. For ICD-10  I10 1 each 0   aspirin 81 MG EC tablet Take 1 tablet (81 mg total) by mouth daily. Swallow whole. 90 tablet 3   atorvastatin (LIPITOR) 80 MG  tablet Take once a day for cholesterol 90 tablet 3   Blood Pressure Monitoring (COMFORT TOUCH BP CUFF/LARGE) MISC 1 kit by Does not apply route daily. 1 each 0   clopidogrel (PLAVIX) 75 MG tablet Take one tablet a day for artery disease 90 tablet 3   cyclobenzaprine (FLEXERIL) 10 MG tablet Take 1 tablet (10 mg total) by mouth 3 (three) times daily as needed for muscle spasms. 30 tablet 0   dapagliflozin propanediol (FARXIGA) 10 MG TABS tablet Take 1 tablet (10 mg total) by  mouth daily before breakfast. 90 tablet 3   Evolocumab (REPATHA SURECLICK) 140 MG/ML SOAJ Inject 140 mg into the skin every 14 (fourteen) days. 2 mL 6   ezetimibe (ZETIA) 10 MG tablet Take 1 tablet (10 mg total) by mouth daily. 90 tablet 3   Fish Oil-Krill Oil (MEGARED ADVANCED 4 IN 1 PO) Take by mouth daily.     folic acid (FOLVITE) 800 MCG tablet folic acid 800 mcg tablet  Take 1 tablet every day by oral route.     furosemide (LASIX) 20 MG tablet Take 1 tablet (20 mg total) by mouth as needed for fluid or edema. 90 tablet 3   gabapentin (NEURONTIN) 100 MG capsule Take 1-3 capsules (100-300 mg total) by mouth 3 (three) times daily. For nerve pain. Use smallest dose needed to help with pain. Will cause drowsiness. 270 capsule 11   ketoconazole (NIZORAL) 2 % shampoo Apply 1 application topically 2 (two) times a week. 120 mL 0   meloxicam (MOBIC) 15 MG tablet TAKE 1 TABLET (15 MG TOTAL) BY MOUTH DAILY. START TOMORROW. 30 tablet 0   metoprolol succinate (TOPROL-XL) 25 MG 24 hr tablet TAKE ONE TABLET ONCE A DAY FOR BLOOD PRESSURE AND HEART RATE 90 tablet 3   mometasone (ELOCON) 0.1 % ointment Mix fingertip length with lotion and apply to arms and back of neck daily. 45 g 3   Multiple Vitamins-Minerals (CENTRUM SILVER PO) Centrum Silver Men 300 mcg-600 mcg-300 mcg tablet  Take 1 tablet every day by oral route.     nitroGLYCERIN (NITROSTAT) 0.4 MG SL tablet Place 1 tablet (0.4 mg total) under the tongue every 5 (five) minutes as needed for chest pain. 25 tablet 3   predniSONE (DELTASONE) 20 MG tablet Take 60mg  PO daily x 3 days, then 40mg  PO daily x 3 days, then 20mg  PO daily x 3 days, then 10mg  PO daily x 4 days. 20 tablet 0   spironolactone (ALDACTONE) 25 MG tablet Take 1 tablet (25 mg total) by mouth daily. 90 tablet 3   No facility-administered medications prior to visit.   No Known Allergies   ROS: A complete ROS was performed with pertinent positives/negatives noted in the HPI. The remainder  of the ROS are negative.    Objective:   There were no vitals filed for this visit.  Physical Exam          GENERAL: Well-appearing, in NAD. Well nourished.  SKIN: Pink, warm and dry. No rash, lesion, ulceration, or ecchymoses.  Head: Normocephalic. NECK: Trachea midline. Full ROM w/o pain or tenderness. No lymphadenopathy.  EARS: Tympanic membranes are intact, translucent without bulging and without drainage. Appropriate landmarks visualized.  EYES: Conjunctiva clear without exudates. EOMI, PERRL, no drainage present.  NOSE: Septum midline w/o deformity. Nares patent, mucosa pink and non-inflamed w/o drainage. No sinus tenderness.  THROAT: Uvula midline. Oropharynx clear. Tonsils non-inflamed without exudate. Mucous membranes pink and moist.  RESPIRATORY: Chest wall symmetrical. Respirations  even and non-labored. Breath sounds clear to auscultation bilaterally.  CARDIAC: S1, S2 present, regular rate and rhythm without murmur or gallops. Peripheral pulses 2+ bilaterally.  MSK: Muscle tone and strength appropriate for age. Joints w/o tenderness, redness, or swelling.  EXTREMITIES: Without clubbing, cyanosis, or edema.  NEUROLOGIC: No motor or sensory deficits. Steady, even gait. C2-C12 intact.  PSYCH/MENTAL STATUS: Alert, oriented x 3. Cooperative, appropriate mood and affect.   Health Maintenance Due  Topic Date Due   Zoster Vaccines- Shingrix (1 of 2) Never done   COVID-19 Vaccine (1 - 2023-24 season) Never done   INFLUENZA VACCINE  01/23/2023    No results found for any visits on 02/05/23.  The ASCVD Risk score (Arnett DK, et al., 2019) failed to calculate for the following reasons:   The valid total cholesterol range is 130 to 320 mg/dL     Assessment & Plan:  *** There are no diagnoses linked to this encounter.  No orders of the defined types were placed in this encounter.  Lab Orders  No laboratory test(s) ordered today   No images are attached to the encounter or  orders placed in the encounter.  No follow-ups on file.    Patient to reach out to office if new, worrisome, or unresolved symptoms arise or if no improvement in patient's condition. Patient verbalized understanding and is agreeable to treatment plan. All questions answered to patient's satisfaction.   Of note, portions of this note may have been created with voice recognition software [employer]ic[employer], medical). While this note has been edited for accuracy, occasional wrong-word or 'sound-a-like' substitutions may have occurred due to the inherent limitations of voice recognition software.  Yolanda Manges, FNP

## 2023-02-05 ENCOUNTER — Encounter (HOSPITAL_BASED_OUTPATIENT_CLINIC_OR_DEPARTMENT_OTHER): Payer: Self-pay | Admitting: Family Medicine

## 2023-02-05 ENCOUNTER — Ambulatory Visit (HOSPITAL_BASED_OUTPATIENT_CLINIC_OR_DEPARTMENT_OTHER): Payer: BC Managed Care – PPO | Admitting: Family Medicine

## 2023-02-05 VITALS — BP 135/74 | HR 57 | Ht 70.0 in | Wt 254.7 lb

## 2023-02-05 DIAGNOSIS — I249 Acute ischemic heart disease, unspecified: Secondary | ICD-10-CM | POA: Diagnosis not present

## 2023-02-05 DIAGNOSIS — G6289 Other specified polyneuropathies: Secondary | ICD-10-CM | POA: Diagnosis not present

## 2023-02-05 DIAGNOSIS — L602 Onychogryphosis: Secondary | ICD-10-CM

## 2023-02-05 MED ORDER — CLOPIDOGREL BISULFATE 75 MG PO TABS: ORAL_TABLET | ORAL | 3 refills | Status: AC

## 2023-02-05 MED ORDER — PREGABALIN 75 MG PO CAPS: 75.0000 mg | ORAL_CAPSULE | Freq: Two times a day (BID) | ORAL | 3 refills | Status: AC

## 2023-02-05 MED ORDER — METOPROLOL SUCCINATE ER 25 MG PO TB24: 25.0000 mg | ORAL_TABLET | Freq: Every day | ORAL | 3 refills | Status: DC

## 2023-02-05 NOTE — Patient Instructions (Signed)
Stop your Gabapentin. Start with Lyrica 1 tablet once a day, can increase to 1 tablet twice a day if needed.

## 2023-02-13 ENCOUNTER — Ambulatory Visit: Payer: BC Managed Care – PPO | Admitting: Podiatry

## 2023-02-13 ENCOUNTER — Encounter: Payer: Self-pay | Admitting: Podiatry

## 2023-02-13 DIAGNOSIS — B351 Tinea unguium: Secondary | ICD-10-CM | POA: Diagnosis not present

## 2023-02-13 DIAGNOSIS — G6289 Other specified polyneuropathies: Secondary | ICD-10-CM

## 2023-02-13 DIAGNOSIS — M79675 Pain in left toe(s): Secondary | ICD-10-CM | POA: Diagnosis not present

## 2023-02-13 DIAGNOSIS — I739 Peripheral vascular disease, unspecified: Secondary | ICD-10-CM | POA: Diagnosis not present

## 2023-02-13 DIAGNOSIS — M79674 Pain in right toe(s): Secondary | ICD-10-CM

## 2023-02-13 NOTE — Progress Notes (Addendum)
This patient presents to the office with chief complaint of long thick nails  This patient  says there  is  no pain and discomfort in his  feet.  This patient says there are long thick painful nails.  These nails are painful walking and wearing shoes.  Patient has no history of infection or drainage from both feet.  Patient is unable to  self treat his own nails . This patient presents  to the office today for treatment of the  long nails due to neuropathy., coagulation defect and kidney disease.  General Appearance  Alert, conversant and in no acute stress.  Vascular  Dorsalis pedis and posterior tibial  pulses are palpable  bilaterally.  Capillary return is within normal limits  bilaterally. Temperature is within normal limits  bilaterally.  Neurologic  Senn-Weinstein monofilament wire test diminished/absent  bilaterally. Muscle power within normal limits bilaterally.  Nails Thick disfigured discolored nails with subungual debris  from hallux to fifth toes bilaterally. No evidence of bacterial infection or drainage bilaterally.  Orthopedic  No limitations of motion of motion feet .  No crepitus or effusions noted.  No bony pathology or digital deformities noted.  Skin  normotropic skin with no porokeratosis noted bilaterally.  No signs of infections or ulcers noted.     Onychomycosis  Diabetes with no foot complications  IE  Debride nails x 10.  A diabetic foot exam was performed and there is no evidence of any vascular or neurologic pathology.   RTC 4 months. Patient says he has pain through both feet and it is severe at times.  Refer to medical podiatirst for evaluation.   Helane Gunther DPM

## 2023-02-13 NOTE — Addendum Note (Signed)
Addended by: Helane Gunther on: 02/13/2023 08:52 AM   Modules accepted: Level of Service

## 2023-02-14 ENCOUNTER — Ambulatory Visit: Payer: BC Managed Care – PPO | Admitting: Podiatry

## 2023-02-14 DIAGNOSIS — Q667 Congenital pes cavus, unspecified foot: Secondary | ICD-10-CM

## 2023-02-14 NOTE — Progress Notes (Signed)
Subjective:  Patient ID: Blake Gomez, male    DOB: 09-Jul-1960,  MRN: [id number]  Chief Complaint  Patient presents with   Numbness    Pt stated that his feet are always numb     62 y.o. male presents with the above complaint.  Patient presents with complaints of bilateral neuropathy pain from unknown etiology.  She is he states that has been present for quite some time it bothers him.  He also has high arch foot structure.   Review of Systems: Negative except as noted in the HPI. Denies N/V/F/Ch.  Past Medical History:  Diagnosis Date   Acute meniscal tear of left knee    Bronchitis 05/03/2021   Bronchitis, mucopurulent recurrent (HCC) 04/2021   CAD (coronary artery disease)    stent 2014.  No MI.  Echo normal.   Chronic renal insufficiency, stage 2 (mild)    Encounter for annual physical exam 08/03/2021   GERD (gastroesophageal reflux disease)    Hay fever    Hematochezia 2017; 2019   Hematochezia/screening: 2017 Colonoscopy normal except adenomatous polyp.  2019 colonoscopy, +polyp, recall 2022.   Hemorrhoids 03/21/2016   History of adenomatous polyp of colon 2017; 2019   Recall 2022   History of hematuria    per old records   Hypercholesterolemia    Hypertension    Left knee pain 2021   MRI 10/2019-> moderate DJD medial mostly, +deg tear of medial and lateral menisci, +effusion, +popliteal cyst->recommended ortho   Obesity, Class II, BMI 35-39.9    Rash and nonspecific skin eruption 05/03/2021   Unilateral primary osteoarthritis, left knee    + deg menisc tears: Dr. August Saucer, steroid inj 11/2019, pt wants to avoid surgery    Current Outpatient Medications:    AMBULATORY NON FORMULARY MEDICATION, Upper arm blood pressure monitor as covered by insurance- electronic. For ICD-10  I10, Disp: 1 each, Rfl: 0   aspirin 81 MG EC tablet, Take 1 tablet (81 mg total) by mouth daily. Swallow whole., Disp: 90 tablet, Rfl: 3   atorvastatin (LIPITOR) 80 MG tablet, Take once a day for  cholesterol, Disp: 90 tablet, Rfl: 3   Blood Pressure Monitoring (COMFORT TOUCH BP CUFF/LARGE) MISC, 1 kit by Does not apply route daily., Disp: 1 each, Rfl: 0   clopidogrel (PLAVIX) 75 MG tablet, Take one tablet a day for artery disease, Disp: 90 tablet, Rfl: 3   cyclobenzaprine (FLEXERIL) 10 MG tablet, Take 1 tablet (10 mg total) by mouth 3 (three) times daily as needed for muscle spasms., Disp: 30 tablet, Rfl: 0   dapagliflozin propanediol (FARXIGA) 10 MG TABS tablet, Take 1 tablet (10 mg total) by mouth daily before breakfast., Disp: 90 tablet, Rfl: 3   Evolocumab (REPATHA SURECLICK) 140 MG/ML SOAJ, Inject 140 mg into the skin every 14 (fourteen) days., Disp: 2 mL, Rfl: 6   ezetimibe (ZETIA) 10 MG tablet, Take 1 tablet (10 mg total) by mouth daily., Disp: 90 tablet, Rfl: 3   Fish Oil-Krill Oil (MEGARED ADVANCED 4 IN 1 PO), Take by mouth daily., Disp: , Rfl:    folic acid (FOLVITE) 800 MCG tablet, folic acid 800 mcg tablet  Take 1 tablet every day by oral route., Disp: , Rfl:    furosemide (LASIX) 20 MG tablet, Take 1 tablet (20 mg total) by mouth as needed for fluid or edema., Disp: 90 tablet, Rfl: 3   ketoconazole (NIZORAL) 2 % shampoo, Apply 1 application topically 2 (two) times a week., Disp: 120 mL, Rfl: 0  meloxicam (MOBIC) 15 MG tablet, TAKE 1 TABLET (15 MG TOTAL) BY MOUTH DAILY. START TOMORROW., Disp: 30 tablet, Rfl: 0   metoprolol succinate (TOPROL-XL) 25 MG 24 hr tablet, Take 1 tablet (25 mg total) by mouth daily., Disp: 90 tablet, Rfl: 3   mometasone (ELOCON) 0.1 % ointment, Mix fingertip length with lotion and apply to arms and back of neck daily., Disp: 45 g, Rfl: 3   Multiple Vitamins-Minerals (CENTRUM SILVER PO), Centrum Silver Men 300 mcg-600 mcg-300 mcg tablet  Take 1 tablet every day by oral route., Disp: , Rfl:    nitroGLYCERIN (NITROSTAT) 0.4 MG SL tablet, Place 1 tablet (0.4 mg total) under the tongue every 5 (five) minutes as needed for chest pain., Disp: 25 tablet, Rfl: 3    pregabalin (LYRICA) 75 MG capsule, Take 1 capsule (75 mg total) by mouth 2 (two) times daily., Disp: 60 capsule, Rfl: 3   spironolactone (ALDACTONE) 25 MG tablet, Take 1 tablet (25 mg total) by mouth daily., Disp: 90 tablet, Rfl: 3  Social History   Tobacco Use  Smoking Status Never  Smokeless Tobacco Never    No Known Allergies Objective:  There were no vitals filed for this visit. There is no height or weight on file to calculate BMI. Constitutional Well developed. Well nourished.  Vascular Dorsalis pedis pulses palpable bilaterally. Posterior tibial pulses palpable bilaterally. Capillary refill normal to all digits.  No cyanosis or clubbing noted. Pedal hair growth normal.  Neurologic Normal speech. Oriented to person, place, and time. Epicritic sensation to light touch grossly present bilaterally.  Dermatologic Nails well groomed and normal in appearance. No open wounds. No skin lesions.  Orthopedic: Patient gait shows high arch foot structure with calcaneal varus that is not reducible with Coleman block test.  Excessive forefoot pressure noted.   Radiographs: None Assessment:   1. Pes cavus    Plan:  Patient was evaluated and treated and all questions answered.  Bilateral numbness/neuropathy with unknown etiology -I explained the patient the etiology of neuropathy and numbness and worse treatment options were discussed.  I primarily discussed gabapentin and Lyrica management.  Ultimately this could also be attributed locally or more focally to the foot with pressure to the tarsal tunnel region.  I believe he will benefit from orthotics.  Pes cavus -I explained to patient the etiology of Pes cavus  and relationship with Planter fasciitis and various treatment options were discussed.  Given patient foot structure in the setting of Planter fasciitis I believe patient will benefit from custom-made orthotics to help control the hindfoot motion support the arch of the foot and  take the stress away from plantar fascial.  Patient agrees with the plan like to proceed with orthotics -Patient was casted for orthotics   No follow-ups on file.   Bilateral pes cavus foot orthotics neuropathy bilateral feet unknown etiology

## 2023-05-08 ENCOUNTER — Encounter (HOSPITAL_BASED_OUTPATIENT_CLINIC_OR_DEPARTMENT_OTHER): Payer: Self-pay | Admitting: Family Medicine

## 2023-05-08 ENCOUNTER — Encounter (HOSPITAL_BASED_OUTPATIENT_CLINIC_OR_DEPARTMENT_OTHER): Payer: Self-pay | Admitting: *Deleted

## 2023-05-08 ENCOUNTER — Ambulatory Visit (HOSPITAL_BASED_OUTPATIENT_CLINIC_OR_DEPARTMENT_OTHER): Payer: BC Managed Care – PPO | Admitting: Family Medicine

## 2023-05-08 DIAGNOSIS — Z0000 Encounter for general adult medical examination without abnormal findings: Secondary | ICD-10-CM

## 2023-05-08 DIAGNOSIS — Z23 Encounter for immunization: Secondary | ICD-10-CM

## 2023-05-08 DIAGNOSIS — I249 Acute ischemic heart disease, unspecified: Secondary | ICD-10-CM

## 2023-05-08 DIAGNOSIS — I10 Essential (primary) hypertension: Secondary | ICD-10-CM | POA: Diagnosis not present

## 2023-05-08 DIAGNOSIS — E782 Mixed hyperlipidemia: Secondary | ICD-10-CM | POA: Diagnosis not present

## 2023-05-08 MED ORDER — SPIRONOLACTONE 25 MG PO TABS: 25.0000 mg | ORAL_TABLET | Freq: Every day | ORAL | 3 refills | Status: AC

## 2023-05-08 MED ORDER — ATORVASTATIN CALCIUM 80 MG PO TABS: ORAL_TABLET | ORAL | 3 refills | Status: AC

## 2023-05-08 MED ORDER — METOPROLOL SUCCINATE ER 25 MG PO TB24: 25.0000 mg | ORAL_TABLET | Freq: Every day | ORAL | 3 refills | Status: DC

## 2023-05-08 NOTE — Progress Notes (Signed)
    Procedures performed today:    None.  Independent interpretation of notes and tests performed by another provider:   None.  Brief History, Exam, Impression, and Recommendations:    BP 118/78 (BP Location: Right Arm, Patient Position: Sitting, Cuff Size: Normal)   Pulse (!) 48   Ht 5\' 10"  (1.778 m)   Wt 256 lb 4.8 oz (116.3 kg)   SpO2 97%   BMI 36.78 kg/m   Encounter for immunization -     Flu vaccine trivalent PF, 6mos and older(Flulaval,Afluria,Fluarix,Fluzone)  Mixed hyperlipidemia Assessment & Plan: Continues to follow with cardiology.  Continue with atorvastatin, ezetimibe, Repatha.  No reported issues or side effects at this time.  Requesting refill of atorvastatin today.  Refill sent to pharmacy on file  Orders: -     Atorvastatin Calcium; Take once a day for cholesterol  Dispense: 90 tablet; Refill: 3  Acute coronary syndrome (HCC) -     Atorvastatin Calcium; Take once a day for cholesterol  Dispense: 90 tablet; Refill: 3 -     Metoprolol Succinate ER; Take 1 tablet (25 mg total) by mouth daily.  Dispense: 90 tablet; Refill: 3 -     Spironolactone; Take 1 tablet (25 mg total) by mouth daily.  Dispense: 90 tablet; Refill: 3  Hypertension, unspecified type Assessment & Plan: Blood pressure at goal in office.  No changes to medications today.  Requesting refill of metoprolol and spironolactone, refills sent to pharmacy on file.  Recommend intermittent monitoring of blood pressure at home, DASH diet.   Orders: -     Atorvastatin Calcium; Take once a day for cholesterol  Dispense: 90 tablet; Refill: 3 -     Spironolactone; Take 1 tablet (25 mg total) by mouth daily.  Dispense: 90 tablet; Refill: 3  Wellness examination -     CBC with Differential/Platelet; Future -     Comprehensive metabolic panel; Future -     Hemoglobin A1c; Future -     Lipid panel; Future -     TSH Rfx on Abnormal to Free T4; Future  Return in about 3 months (around 08/08/2023) for CPE with  fasting labs 1 week prior.   ___________________________________________ Buena Boehm de [place], MD, ABFM, CAQSM Primary Care and Sports Medicine [facility]

## 2023-05-08 NOTE — Assessment & Plan Note (Signed)
Continues to follow with cardiology.  Continue with atorvastatin, ezetimibe, Repatha.  No reported issues or side effects at this time.  Requesting refill of atorvastatin today.  Refill sent to pharmacy on file

## 2023-05-08 NOTE — Assessment & Plan Note (Signed)
Blood pressure at goal in office.  No changes to medications today.  Requesting refill of metoprolol and spironolactone, refills sent to pharmacy on file.  Recommend intermittent monitoring of blood pressure at home, DASH diet.

## 2023-06-16 ENCOUNTER — Encounter: Payer: Self-pay | Admitting: Podiatry

## 2023-06-16 ENCOUNTER — Ambulatory Visit: Payer: BC Managed Care – PPO | Admitting: Podiatry

## 2023-06-16 VITALS — Ht 70.0 in | Wt 256.3 lb

## 2023-06-16 DIAGNOSIS — M79675 Pain in left toe(s): Secondary | ICD-10-CM

## 2023-06-16 DIAGNOSIS — M79674 Pain in right toe(s): Secondary | ICD-10-CM | POA: Diagnosis not present

## 2023-06-16 DIAGNOSIS — Q6672 Congenital pes cavus, left foot: Secondary | ICD-10-CM

## 2023-06-16 DIAGNOSIS — G6289 Other specified polyneuropathies: Secondary | ICD-10-CM

## 2023-06-16 DIAGNOSIS — B351 Tinea unguium: Secondary | ICD-10-CM | POA: Diagnosis not present

## 2023-06-16 DIAGNOSIS — Q667 Congenital pes cavus, unspecified foot: Secondary | ICD-10-CM

## 2023-06-16 NOTE — Progress Notes (Signed)
Patient presents today to pick up custom molded foot orthotics, diagnosed with pes cavus by Dr. Allena Katz.   Orthotics were dispensed and fit was satisfactory. Reviewed instructions for break-in and wear. Written instructions given to patient.  Patient will follow up as needed.  Addison Bailey CPed,CFo, CFm

## 2023-06-16 NOTE — Progress Notes (Signed)
This patient presents to the office with chief complaint of long thick nails  This patient  says there  is  no pain and discomfort in his  feet.  This patient says there are long thick painful nails.  These nails are painful walking and wearing shoes.  Patient has no history of infection or drainage from both feet.  Patient is unable to  self treat his own nails . This patient presents  to the office today for treatment of the  long nails due to neuropathy., coagulation defect and kidney disease.  General Appearance  Alert, conversant and in no acute stress.  Vascular  Dorsalis pedis and posterior tibial  pulses are palpable  bilaterally.  Capillary return is within normal limits  bilaterally. Temperature is within normal limits  bilaterally.  Neurologic  Senn-Weinstein monofilament wire test diminished/absent  bilaterally. Muscle power within normal limits bilaterally.  Nails Thick disfigured discolored nails with subungual debris  from hallux to fifth toes bilaterally. No evidence of bacterial infection or drainage bilaterally.  Orthopedic  No limitations of motion of motion feet .  No crepitus or effusions noted.  No bony pathology or digital deformities noted.  Skin  normotropic skin with no porokeratosis noted bilaterally.  No signs of infections or ulcers noted.     Onychomycosis  Diabetes with no foot complications  IE  Debride nails x 10.  A diabetic foot exam was performed and there is no evidence of any vascular or neurologic pathology.   RTC 4 months.    Helane Gunther DPM

## 2023-08-01 ENCOUNTER — Other Ambulatory Visit (HOSPITAL_BASED_OUTPATIENT_CLINIC_OR_DEPARTMENT_OTHER): Payer: BC Managed Care – PPO

## 2023-08-06 DIAGNOSIS — J209 Acute bronchitis, unspecified: Secondary | ICD-10-CM | POA: Diagnosis not present

## 2023-08-06 DIAGNOSIS — R051 Acute cough: Secondary | ICD-10-CM | POA: Diagnosis not present

## 2023-08-06 DIAGNOSIS — R0981 Nasal congestion: Secondary | ICD-10-CM | POA: Diagnosis not present

## 2023-08-08 ENCOUNTER — Encounter (HOSPITAL_BASED_OUTPATIENT_CLINIC_OR_DEPARTMENT_OTHER): Payer: BC Managed Care – PPO | Admitting: Family Medicine

## 2023-08-25 ENCOUNTER — Ambulatory Visit: Payer: BC Managed Care – PPO | Admitting: Dermatology

## 2023-08-27 ENCOUNTER — Encounter (HOSPITAL_BASED_OUTPATIENT_CLINIC_OR_DEPARTMENT_OTHER): Payer: Self-pay | Admitting: *Deleted

## 2023-10-15 ENCOUNTER — Ambulatory Visit: Payer: BC Managed Care – PPO | Admitting: Podiatry

## 2023-12-27 DIAGNOSIS — H5711 Ocular pain, right eye: Secondary | ICD-10-CM | POA: Diagnosis not present

## 2023-12-27 DIAGNOSIS — T1591XA Foreign body on external eye, part unspecified, right eye, initial encounter: Secondary | ICD-10-CM | POA: Diagnosis not present

## 2024-02-17 DIAGNOSIS — R070 Pain in throat: Secondary | ICD-10-CM | POA: Diagnosis not present

## 2024-02-17 DIAGNOSIS — R0981 Nasal congestion: Secondary | ICD-10-CM | POA: Diagnosis not present

## 2024-02-17 DIAGNOSIS — R051 Acute cough: Secondary | ICD-10-CM | POA: Diagnosis not present

## 2024-02-17 DIAGNOSIS — J209 Acute bronchitis, unspecified: Secondary | ICD-10-CM | POA: Diagnosis not present

## 2024-03-18 NOTE — Progress Notes (Signed)
 Blake Gomez                                          MRN: [id number]   03/18/2024   The [facility] Quality Team Specialist reviewed this patient medical record for the purposes of chart review for care gap closure. The following were reviewed: chart review for care gap closure-controlling blood pressure.    [facility] Quality Team

## 2024-05-14 ENCOUNTER — Other Ambulatory Visit (HOSPITAL_BASED_OUTPATIENT_CLINIC_OR_DEPARTMENT_OTHER): Payer: Self-pay | Admitting: Family Medicine

## 2024-05-14 DIAGNOSIS — I249 Acute ischemic heart disease, unspecified: Secondary | ICD-10-CM
